# Patient Record
Sex: Female | Born: 1975 | Race: White | Hispanic: No | Marital: Single | Smoking: Current every day smoker
Health system: Southern US, Community
[De-identification: ages and names within clinical notes are randomized; demographics above are authoritative.]

## PROBLEM LIST (undated history)

## (undated) DIAGNOSIS — L0292 Furuncle, unspecified: Secondary | ICD-10-CM

## (undated) DIAGNOSIS — M1990 Unspecified osteoarthritis, unspecified site: Secondary | ICD-10-CM

## (undated) DIAGNOSIS — F339 Major depressive disorder, recurrent, unspecified: Secondary | ICD-10-CM

## (undated) DIAGNOSIS — N920 Excessive and frequent menstruation with regular cycle: Secondary | ICD-10-CM

## (undated) DIAGNOSIS — R222 Localized swelling, mass and lump, trunk: Secondary | ICD-10-CM

## (undated) DIAGNOSIS — L0293 Carbuncle, unspecified: Secondary | ICD-10-CM

## (undated) DIAGNOSIS — A630 Anogenital (venereal) warts: Secondary | ICD-10-CM

## (undated) DIAGNOSIS — N393 Stress incontinence (female) (male): Secondary | ICD-10-CM

## (undated) DIAGNOSIS — F419 Anxiety disorder, unspecified: Secondary | ICD-10-CM

## (undated) DIAGNOSIS — G43909 Migraine, unspecified, not intractable, without status migrainosus: Secondary | ICD-10-CM

## (undated) DIAGNOSIS — J45909 Unspecified asthma, uncomplicated: Secondary | ICD-10-CM

## (undated) DIAGNOSIS — I10 Essential (primary) hypertension: Secondary | ICD-10-CM

## (undated) HISTORY — DX: Carbuncle, unspecified: L02.93

## (undated) HISTORY — DX: Excessive and frequent menstruation with regular cycle: N92.0

## (undated) HISTORY — DX: Anogenital (venereal) warts: A63.0

## (undated) HISTORY — PX: TUBAL LIGATION: SHX77

## (undated) HISTORY — DX: Furuncle, unspecified: L02.92

## (undated) HISTORY — DX: Anxiety disorder, unspecified: F41.9

## (undated) HISTORY — DX: Major depressive disorder, recurrent, unspecified: F33.9

## (undated) HISTORY — DX: Stress incontinence (female) (male): N39.3

---

## 1998-10-10 ENCOUNTER — Emergency Department (HOSPITAL_COMMUNITY): Admission: EM | Admit: 1998-10-10 | Discharge: 1998-10-10 | Payer: Self-pay | Admitting: *Deleted

## 1998-10-10 ENCOUNTER — Encounter: Payer: Self-pay | Admitting: Emergency Medicine

## 1999-03-07 ENCOUNTER — Encounter: Admission: RE | Admit: 1999-03-07 | Discharge: 1999-03-07 | Payer: Self-pay | Admitting: Family Medicine

## 1999-04-03 ENCOUNTER — Encounter: Admission: RE | Admit: 1999-04-03 | Discharge: 1999-04-03 | Payer: Self-pay | Admitting: Family Medicine

## 1999-06-20 ENCOUNTER — Encounter: Admission: RE | Admit: 1999-06-20 | Discharge: 1999-06-20 | Payer: Self-pay | Admitting: Family Medicine

## 1999-07-04 ENCOUNTER — Encounter: Admission: RE | Admit: 1999-07-04 | Discharge: 1999-07-04 | Payer: Self-pay | Admitting: Family Medicine

## 1999-07-17 ENCOUNTER — Encounter: Admission: RE | Admit: 1999-07-17 | Discharge: 1999-07-17 | Payer: Self-pay | Admitting: Family Medicine

## 1999-09-20 ENCOUNTER — Encounter: Admission: RE | Admit: 1999-09-20 | Discharge: 1999-09-20 | Payer: Self-pay | Admitting: Family Medicine

## 1999-11-14 ENCOUNTER — Encounter: Admission: RE | Admit: 1999-11-14 | Discharge: 1999-11-14 | Payer: Self-pay | Admitting: Family Medicine

## 2000-11-07 ENCOUNTER — Other Ambulatory Visit: Admission: RE | Admit: 2000-11-07 | Discharge: 2000-11-07 | Payer: Self-pay | Admitting: Family Medicine

## 2000-11-07 ENCOUNTER — Encounter: Admission: RE | Admit: 2000-11-07 | Discharge: 2000-11-07 | Payer: Self-pay | Admitting: Family Medicine

## 2000-12-23 ENCOUNTER — Encounter: Admission: RE | Admit: 2000-12-23 | Discharge: 2000-12-23 | Payer: Self-pay | Admitting: Family Medicine

## 2001-01-30 ENCOUNTER — Encounter: Admission: RE | Admit: 2001-01-30 | Discharge: 2001-01-30 | Payer: Self-pay | Admitting: Family Medicine

## 2001-02-06 ENCOUNTER — Encounter: Payer: Self-pay | Admitting: Emergency Medicine

## 2001-02-06 ENCOUNTER — Emergency Department (HOSPITAL_COMMUNITY): Admission: EM | Admit: 2001-02-06 | Discharge: 2001-02-06 | Payer: Self-pay | Admitting: Emergency Medicine

## 2001-02-12 ENCOUNTER — Encounter: Admission: RE | Admit: 2001-02-12 | Discharge: 2001-02-12 | Payer: Self-pay | Admitting: Sports Medicine

## 2001-02-12 ENCOUNTER — Encounter: Payer: Self-pay | Admitting: Sports Medicine

## 2001-02-17 ENCOUNTER — Encounter: Admission: RE | Admit: 2001-02-17 | Discharge: 2001-02-17 | Payer: Self-pay | Admitting: Family Medicine

## 2001-03-23 ENCOUNTER — Emergency Department (HOSPITAL_COMMUNITY): Admission: EM | Admit: 2001-03-23 | Discharge: 2001-03-23 | Payer: Self-pay | Admitting: Emergency Medicine

## 2001-03-24 ENCOUNTER — Encounter: Admission: RE | Admit: 2001-03-24 | Discharge: 2001-03-24 | Payer: Self-pay | Admitting: Family Medicine

## 2001-05-01 ENCOUNTER — Encounter: Admission: RE | Admit: 2001-05-01 | Discharge: 2001-05-01 | Payer: Self-pay | Admitting: Family Medicine

## 2001-06-03 ENCOUNTER — Encounter: Admission: RE | Admit: 2001-06-03 | Discharge: 2001-06-03 | Payer: Self-pay | Admitting: Family Medicine

## 2001-09-07 ENCOUNTER — Encounter: Admission: RE | Admit: 2001-09-07 | Discharge: 2001-09-07 | Payer: Self-pay | Admitting: Family Medicine

## 2001-09-21 ENCOUNTER — Encounter: Admission: RE | Admit: 2001-09-21 | Discharge: 2001-09-21 | Payer: Self-pay | Admitting: Family Medicine

## 2001-11-03 ENCOUNTER — Encounter: Admission: RE | Admit: 2001-11-03 | Discharge: 2001-11-03 | Payer: Self-pay | Admitting: Family Medicine

## 2001-11-05 ENCOUNTER — Encounter: Admission: RE | Admit: 2001-11-05 | Discharge: 2001-11-05 | Payer: Self-pay | Admitting: Sports Medicine

## 2001-11-17 ENCOUNTER — Encounter: Admission: RE | Admit: 2001-11-17 | Discharge: 2001-11-17 | Payer: Self-pay | Admitting: Family Medicine

## 2002-01-01 ENCOUNTER — Encounter: Payer: Self-pay | Admitting: Sports Medicine

## 2002-01-01 ENCOUNTER — Encounter: Admission: RE | Admit: 2002-01-01 | Discharge: 2002-01-01 | Payer: Self-pay | Admitting: Sports Medicine

## 2002-01-01 ENCOUNTER — Encounter: Admission: RE | Admit: 2002-01-01 | Discharge: 2002-01-01 | Payer: Self-pay | Admitting: Family Medicine

## 2002-01-15 ENCOUNTER — Emergency Department (HOSPITAL_COMMUNITY): Admission: EM | Admit: 2002-01-15 | Discharge: 2002-01-15 | Payer: Self-pay | Admitting: Emergency Medicine

## 2002-01-15 ENCOUNTER — Encounter: Admission: RE | Admit: 2002-01-15 | Discharge: 2002-01-15 | Payer: Self-pay | Admitting: Family Medicine

## 2002-02-24 ENCOUNTER — Encounter: Payer: Self-pay | Admitting: Emergency Medicine

## 2002-02-24 ENCOUNTER — Emergency Department (HOSPITAL_COMMUNITY): Admission: EM | Admit: 2002-02-24 | Discharge: 2002-02-24 | Payer: Self-pay | Admitting: Emergency Medicine

## 2002-02-26 ENCOUNTER — Emergency Department (HOSPITAL_COMMUNITY): Admission: EM | Admit: 2002-02-26 | Discharge: 2002-02-26 | Payer: Self-pay

## 2002-07-06 ENCOUNTER — Encounter: Admission: RE | Admit: 2002-07-06 | Discharge: 2002-07-06 | Payer: Self-pay | Admitting: Family Medicine

## 2002-07-07 ENCOUNTER — Encounter: Admission: RE | Admit: 2002-07-07 | Discharge: 2002-07-07 | Payer: Self-pay | Admitting: Family Medicine

## 2002-07-29 ENCOUNTER — Encounter: Admission: RE | Admit: 2002-07-29 | Discharge: 2002-07-29 | Payer: Self-pay | Admitting: Family Medicine

## 2002-07-29 ENCOUNTER — Other Ambulatory Visit: Admission: RE | Admit: 2002-07-29 | Discharge: 2002-07-29 | Payer: Self-pay | Admitting: Family Medicine

## 2002-08-31 ENCOUNTER — Encounter: Admission: RE | Admit: 2002-08-31 | Discharge: 2002-08-31 | Payer: Self-pay | Admitting: Family Medicine

## 2002-09-10 ENCOUNTER — Encounter: Admission: RE | Admit: 2002-09-10 | Discharge: 2002-09-10 | Payer: Self-pay | Admitting: Family Medicine

## 2002-09-29 ENCOUNTER — Encounter: Admission: RE | Admit: 2002-09-29 | Discharge: 2002-09-29 | Payer: Self-pay | Admitting: Family Medicine

## 2002-10-06 ENCOUNTER — Ambulatory Visit (HOSPITAL_COMMUNITY): Admission: RE | Admit: 2002-10-06 | Discharge: 2002-10-06 | Payer: Self-pay | Admitting: Family Medicine

## 2002-10-07 ENCOUNTER — Emergency Department (HOSPITAL_COMMUNITY): Admission: EM | Admit: 2002-10-07 | Discharge: 2002-10-07 | Payer: Self-pay

## 2002-10-27 ENCOUNTER — Encounter: Admission: RE | Admit: 2002-10-27 | Discharge: 2002-10-27 | Payer: Self-pay | Admitting: Family Medicine

## 2002-11-12 ENCOUNTER — Encounter: Admission: RE | Admit: 2002-11-12 | Discharge: 2002-11-12 | Payer: Self-pay | Admitting: Family Medicine

## 2002-12-01 ENCOUNTER — Encounter: Admission: RE | Admit: 2002-12-01 | Discharge: 2002-12-01 | Payer: Self-pay | Admitting: Family Medicine

## 2002-12-06 ENCOUNTER — Encounter: Admission: RE | Admit: 2002-12-06 | Discharge: 2002-12-06 | Payer: Self-pay | Admitting: Family Medicine

## 2002-12-08 ENCOUNTER — Inpatient Hospital Stay (HOSPITAL_COMMUNITY): Admission: AD | Admit: 2002-12-08 | Discharge: 2002-12-08 | Payer: Self-pay | Admitting: Obstetrics and Gynecology

## 2002-12-17 ENCOUNTER — Encounter: Admission: RE | Admit: 2002-12-17 | Discharge: 2002-12-17 | Payer: Self-pay | Admitting: Family Medicine

## 2002-12-31 ENCOUNTER — Encounter: Admission: RE | Admit: 2002-12-31 | Discharge: 2002-12-31 | Payer: Self-pay | Admitting: Family Medicine

## 2003-01-12 ENCOUNTER — Encounter: Admission: RE | Admit: 2003-01-12 | Discharge: 2003-01-12 | Payer: Self-pay | Admitting: Family Medicine

## 2003-01-31 ENCOUNTER — Inpatient Hospital Stay (HOSPITAL_COMMUNITY): Admission: AD | Admit: 2003-01-31 | Discharge: 2003-01-31 | Payer: Self-pay | Admitting: Family Medicine

## 2003-02-01 ENCOUNTER — Encounter: Admission: RE | Admit: 2003-02-01 | Discharge: 2003-02-01 | Payer: Self-pay | Admitting: Family Medicine

## 2003-02-08 ENCOUNTER — Encounter: Admission: RE | Admit: 2003-02-08 | Discharge: 2003-02-08 | Payer: Self-pay | Admitting: *Deleted

## 2003-02-09 ENCOUNTER — Ambulatory Visit (HOSPITAL_COMMUNITY): Admission: RE | Admit: 2003-02-09 | Discharge: 2003-02-09 | Payer: Self-pay | Admitting: *Deleted

## 2003-02-09 ENCOUNTER — Inpatient Hospital Stay (HOSPITAL_COMMUNITY): Admission: AD | Admit: 2003-02-09 | Discharge: 2003-02-09 | Payer: Self-pay | Admitting: Obstetrics and Gynecology

## 2003-02-10 ENCOUNTER — Encounter: Admission: RE | Admit: 2003-02-10 | Discharge: 2003-02-10 | Payer: Self-pay | Admitting: *Deleted

## 2003-02-10 ENCOUNTER — Encounter: Admission: RE | Admit: 2003-02-10 | Discharge: 2003-02-10 | Payer: Self-pay | Admitting: Family Medicine

## 2003-02-14 ENCOUNTER — Encounter: Admission: RE | Admit: 2003-02-14 | Discharge: 2003-02-14 | Payer: Self-pay | Admitting: *Deleted

## 2003-02-15 ENCOUNTER — Inpatient Hospital Stay (HOSPITAL_COMMUNITY): Admission: RE | Admit: 2003-02-15 | Discharge: 2003-02-15 | Payer: Self-pay | Admitting: Family Medicine

## 2003-02-15 ENCOUNTER — Encounter: Payer: Self-pay | Admitting: Family Medicine

## 2003-02-15 ENCOUNTER — Encounter: Admission: RE | Admit: 2003-02-15 | Discharge: 2003-02-15 | Payer: Self-pay | Admitting: *Deleted

## 2003-02-16 ENCOUNTER — Inpatient Hospital Stay (HOSPITAL_COMMUNITY): Admission: AD | Admit: 2003-02-16 | Discharge: 2003-02-19 | Payer: Self-pay | Admitting: Obstetrics and Gynecology

## 2003-02-16 ENCOUNTER — Encounter (INDEPENDENT_AMBULATORY_CARE_PROVIDER_SITE_OTHER): Payer: Self-pay | Admitting: Specialist

## 2003-02-23 ENCOUNTER — Encounter: Admission: RE | Admit: 2003-02-23 | Discharge: 2003-02-23 | Payer: Self-pay | Admitting: Family Medicine

## 2003-04-27 ENCOUNTER — Encounter: Admission: RE | Admit: 2003-04-27 | Discharge: 2003-04-27 | Payer: Self-pay | Admitting: Sports Medicine

## 2003-05-31 ENCOUNTER — Encounter: Admission: RE | Admit: 2003-05-31 | Discharge: 2003-08-09 | Payer: Self-pay | Admitting: Occupational Medicine

## 2003-05-31 ENCOUNTER — Encounter: Admission: RE | Admit: 2003-05-31 | Discharge: 2003-05-31 | Payer: Self-pay | Admitting: Occupational Medicine

## 2005-04-05 ENCOUNTER — Emergency Department (HOSPITAL_COMMUNITY): Admission: EM | Admit: 2005-04-05 | Discharge: 2005-04-05 | Payer: Self-pay | Admitting: Emergency Medicine

## 2005-10-07 ENCOUNTER — Emergency Department (HOSPITAL_COMMUNITY): Admission: EM | Admit: 2005-10-07 | Discharge: 2005-10-07 | Payer: Self-pay | Admitting: Family Medicine

## 2005-11-26 ENCOUNTER — Emergency Department (HOSPITAL_COMMUNITY): Admission: EM | Admit: 2005-11-26 | Discharge: 2005-11-26 | Payer: Self-pay | Admitting: Emergency Medicine

## 2006-03-21 ENCOUNTER — Emergency Department (HOSPITAL_COMMUNITY): Admission: EM | Admit: 2006-03-21 | Discharge: 2006-03-21 | Payer: Self-pay | Admitting: Family Medicine

## 2006-03-22 ENCOUNTER — Emergency Department (HOSPITAL_COMMUNITY): Admission: EM | Admit: 2006-03-22 | Discharge: 2006-03-22 | Payer: Self-pay | Admitting: Emergency Medicine

## 2006-04-03 ENCOUNTER — Emergency Department (HOSPITAL_COMMUNITY): Admission: EM | Admit: 2006-04-03 | Discharge: 2006-04-03 | Payer: Self-pay | Admitting: Family Medicine

## 2006-04-07 ENCOUNTER — Ambulatory Visit: Payer: Self-pay | Admitting: Family Medicine

## 2006-04-24 ENCOUNTER — Ambulatory Visit: Payer: Self-pay | Admitting: Family Medicine

## 2006-05-28 ENCOUNTER — Ambulatory Visit: Payer: Self-pay | Admitting: Family Medicine

## 2006-06-06 ENCOUNTER — Ambulatory Visit: Payer: Self-pay | Admitting: Family Medicine

## 2006-06-27 ENCOUNTER — Ambulatory Visit: Payer: Self-pay | Admitting: Family Medicine

## 2006-07-22 ENCOUNTER — Encounter (INDEPENDENT_AMBULATORY_CARE_PROVIDER_SITE_OTHER): Payer: Self-pay | Admitting: *Deleted

## 2006-08-01 ENCOUNTER — Encounter (INDEPENDENT_AMBULATORY_CARE_PROVIDER_SITE_OTHER): Payer: Self-pay | Admitting: Family Medicine

## 2006-08-01 ENCOUNTER — Other Ambulatory Visit: Admission: RE | Admit: 2006-08-01 | Discharge: 2006-08-01 | Payer: Self-pay | Admitting: Family Medicine

## 2006-08-01 ENCOUNTER — Ambulatory Visit: Payer: Self-pay | Admitting: Family Medicine

## 2006-08-01 LAB — CONVERTED CEMR LAB: Chlamydia, DNA Probe: NEGATIVE

## 2006-09-18 DIAGNOSIS — E669 Obesity, unspecified: Secondary | ICD-10-CM | POA: Insufficient documentation

## 2006-09-18 DIAGNOSIS — G43909 Migraine, unspecified, not intractable, without status migrainosus: Secondary | ICD-10-CM | POA: Insufficient documentation

## 2006-09-18 DIAGNOSIS — J45909 Unspecified asthma, uncomplicated: Secondary | ICD-10-CM | POA: Insufficient documentation

## 2006-09-18 DIAGNOSIS — F17200 Nicotine dependence, unspecified, uncomplicated: Secondary | ICD-10-CM | POA: Insufficient documentation

## 2006-09-18 DIAGNOSIS — N393 Stress incontinence (female) (male): Secondary | ICD-10-CM | POA: Insufficient documentation

## 2006-09-18 DIAGNOSIS — F339 Major depressive disorder, recurrent, unspecified: Secondary | ICD-10-CM

## 2006-09-18 HISTORY — DX: Major depressive disorder, recurrent, unspecified: F33.9

## 2006-09-18 HISTORY — DX: Stress incontinence (female) (male): N39.3

## 2006-09-19 ENCOUNTER — Encounter (INDEPENDENT_AMBULATORY_CARE_PROVIDER_SITE_OTHER): Payer: Self-pay | Admitting: *Deleted

## 2006-10-10 ENCOUNTER — Emergency Department (HOSPITAL_COMMUNITY): Admission: EM | Admit: 2006-10-10 | Discharge: 2006-10-10 | Payer: Self-pay | Admitting: Family Medicine

## 2006-11-07 ENCOUNTER — Ambulatory Visit: Payer: Self-pay | Admitting: Family Medicine

## 2006-11-07 DIAGNOSIS — E785 Hyperlipidemia, unspecified: Secondary | ICD-10-CM | POA: Insufficient documentation

## 2006-11-10 ENCOUNTER — Telehealth: Payer: Self-pay | Admitting: *Deleted

## 2006-11-19 ENCOUNTER — Telehealth: Payer: Self-pay | Admitting: *Deleted

## 2006-11-21 ENCOUNTER — Telehealth: Payer: Self-pay | Admitting: *Deleted

## 2006-11-26 ENCOUNTER — Telehealth: Payer: Self-pay | Admitting: *Deleted

## 2006-11-26 ENCOUNTER — Ambulatory Visit: Payer: Self-pay | Admitting: Family Medicine

## 2006-12-11 ENCOUNTER — Emergency Department (HOSPITAL_COMMUNITY): Admission: EM | Admit: 2006-12-11 | Discharge: 2006-12-11 | Payer: Self-pay | Admitting: Family Medicine

## 2006-12-26 ENCOUNTER — Telehealth: Payer: Self-pay | Admitting: *Deleted

## 2006-12-30 ENCOUNTER — Encounter (INDEPENDENT_AMBULATORY_CARE_PROVIDER_SITE_OTHER): Payer: Self-pay | Admitting: Family Medicine

## 2006-12-30 ENCOUNTER — Ambulatory Visit: Payer: Self-pay | Admitting: Sports Medicine

## 2006-12-30 DIAGNOSIS — L0292 Furuncle, unspecified: Secondary | ICD-10-CM | POA: Insufficient documentation

## 2006-12-30 DIAGNOSIS — L0293 Carbuncle, unspecified: Secondary | ICD-10-CM

## 2006-12-30 HISTORY — DX: Furuncle, unspecified: L02.92

## 2006-12-30 LAB — CONVERTED CEMR LAB
Hepatitis B Surface Ag: NEGATIVE
KOH Prep: NEGATIVE

## 2007-01-03 ENCOUNTER — Emergency Department (HOSPITAL_COMMUNITY): Admission: EM | Admit: 2007-01-03 | Discharge: 2007-01-03 | Payer: Self-pay | Admitting: Family Medicine

## 2007-01-06 ENCOUNTER — Telehealth: Payer: Self-pay | Admitting: *Deleted

## 2007-01-06 ENCOUNTER — Ambulatory Visit: Payer: Self-pay | Admitting: Sports Medicine

## 2007-04-22 ENCOUNTER — Telehealth (INDEPENDENT_AMBULATORY_CARE_PROVIDER_SITE_OTHER): Payer: Self-pay | Admitting: *Deleted

## 2007-04-22 ENCOUNTER — Ambulatory Visit: Payer: Self-pay | Admitting: Family Medicine

## 2007-04-22 LAB — CONVERTED CEMR LAB: Whiff Test: POSITIVE

## 2007-11-23 ENCOUNTER — Ambulatory Visit: Payer: Self-pay | Admitting: Sports Medicine

## 2007-11-23 ENCOUNTER — Encounter (INDEPENDENT_AMBULATORY_CARE_PROVIDER_SITE_OTHER): Payer: Self-pay | Admitting: Family Medicine

## 2007-11-23 DIAGNOSIS — N898 Other specified noninflammatory disorders of vagina: Secondary | ICD-10-CM | POA: Insufficient documentation

## 2007-11-23 LAB — CONVERTED CEMR LAB
Ketones, urine, test strip: NEGATIVE
Nitrite: NEGATIVE
Protein, U semiquant: NEGATIVE
Urobilinogen, UA: 0.2

## 2007-11-24 ENCOUNTER — Telehealth (INDEPENDENT_AMBULATORY_CARE_PROVIDER_SITE_OTHER): Payer: Self-pay | Admitting: *Deleted

## 2007-11-24 LAB — CONVERTED CEMR LAB
ALT: 24 units/L (ref 0–35)
AST: 19 units/L (ref 0–37)
Albumin: 4.5 g/dL (ref 3.5–5.2)
Alkaline Phosphatase: 68 units/L (ref 39–117)
GC Probe Amp, Genital: NEGATIVE
Glucose, Bld: 86 mg/dL (ref 70–99)
HCV Ab: NEGATIVE
Potassium: 4.5 meq/L (ref 3.5–5.3)
Sodium: 139 meq/L (ref 135–145)
Total Protein: 7.3 g/dL (ref 6.0–8.3)

## 2007-11-30 ENCOUNTER — Encounter: Payer: Self-pay | Admitting: *Deleted

## 2008-04-05 ENCOUNTER — Emergency Department (HOSPITAL_COMMUNITY): Admission: EM | Admit: 2008-04-05 | Discharge: 2008-04-05 | Payer: Self-pay | Admitting: Internal Medicine

## 2008-06-07 ENCOUNTER — Telehealth: Payer: Self-pay | Admitting: *Deleted

## 2008-06-07 ENCOUNTER — Encounter: Payer: Self-pay | Admitting: Family Medicine

## 2008-06-07 ENCOUNTER — Ambulatory Visit: Payer: Self-pay | Admitting: Family Medicine

## 2008-06-07 LAB — CONVERTED CEMR LAB
Bilirubin Urine: NEGATIVE
Blood in Urine, dipstick: NEGATIVE
Glucose, Urine, Semiquant: NEGATIVE
Protein, U semiquant: 30
Specific Gravity, Urine: 1.02
Whiff Test: NEGATIVE
pH: 7

## 2008-06-08 ENCOUNTER — Telehealth: Payer: Self-pay | Admitting: Family Medicine

## 2008-10-13 ENCOUNTER — Emergency Department (HOSPITAL_COMMUNITY): Admission: EM | Admit: 2008-10-13 | Discharge: 2008-10-13 | Payer: Self-pay | Admitting: Emergency Medicine

## 2009-04-01 ENCOUNTER — Emergency Department (HOSPITAL_COMMUNITY): Admission: EM | Admit: 2009-04-01 | Discharge: 2009-04-01 | Payer: Self-pay | Admitting: Emergency Medicine

## 2009-04-22 ENCOUNTER — Emergency Department (HOSPITAL_COMMUNITY): Admission: EM | Admit: 2009-04-22 | Discharge: 2009-04-23 | Payer: Self-pay | Admitting: Emergency Medicine

## 2009-06-05 ENCOUNTER — Ambulatory Visit: Payer: Self-pay | Admitting: Family Medicine

## 2009-06-05 ENCOUNTER — Encounter (INDEPENDENT_AMBULATORY_CARE_PROVIDER_SITE_OTHER): Payer: Self-pay | Admitting: Family Medicine

## 2009-06-05 ENCOUNTER — Other Ambulatory Visit: Admission: RE | Admit: 2009-06-05 | Discharge: 2009-06-05 | Payer: Self-pay | Admitting: Family Medicine

## 2009-06-05 ENCOUNTER — Encounter: Payer: Self-pay | Admitting: Family Medicine

## 2009-06-05 DIAGNOSIS — A630 Anogenital (venereal) warts: Secondary | ICD-10-CM

## 2009-06-05 DIAGNOSIS — I10 Essential (primary) hypertension: Secondary | ICD-10-CM | POA: Insufficient documentation

## 2009-06-05 HISTORY — DX: Anogenital (venereal) warts: A63.0

## 2009-06-05 LAB — CONVERTED CEMR LAB: Whiff Test: POSITIVE

## 2009-06-14 ENCOUNTER — Telehealth: Payer: Self-pay | Admitting: Family Medicine

## 2009-06-19 ENCOUNTER — Encounter: Payer: Self-pay | Admitting: Family Medicine

## 2009-06-27 ENCOUNTER — Ambulatory Visit: Payer: Self-pay | Admitting: Family Medicine

## 2009-06-27 ENCOUNTER — Encounter: Payer: Self-pay | Admitting: Family Medicine

## 2009-06-27 DIAGNOSIS — N920 Excessive and frequent menstruation with regular cycle: Secondary | ICD-10-CM | POA: Insufficient documentation

## 2009-06-27 HISTORY — DX: Excessive and frequent menstruation with regular cycle: N92.0

## 2009-06-27 LAB — CONVERTED CEMR LAB: Beta hcg, urine, semiquantitative: NEGATIVE

## 2009-06-28 LAB — CONVERTED CEMR LAB
Basophils Relative: 0 % (ref 0–1)
Eosinophils Absolute: 0.2 10*3/uL (ref 0.0–0.7)
Lymphs Abs: 2.9 10*3/uL (ref 0.7–4.0)
MCV: 94.5 fL (ref 78.0–100.0)
Monocytes Relative: 5 % (ref 3–12)
Neutro Abs: 5 10*3/uL (ref 1.7–7.7)
Neutrophils Relative %: 58 % (ref 43–77)
Platelets: 248 10*3/uL (ref 150–400)
RBC: 4.54 M/uL (ref 3.87–5.11)
TSH: 0.921 microintl units/mL (ref 0.350–4.500)
WBC: 8.5 10*3/uL (ref 4.0–10.5)

## 2009-07-10 ENCOUNTER — Encounter: Admission: RE | Admit: 2009-07-10 | Discharge: 2009-07-10 | Payer: Self-pay | Admitting: Family Medicine

## 2009-07-10 ENCOUNTER — Telehealth: Payer: Self-pay | Admitting: Family Medicine

## 2009-07-31 ENCOUNTER — Telehealth: Payer: Self-pay | Admitting: Family Medicine

## 2009-08-01 ENCOUNTER — Ambulatory Visit: Payer: Self-pay | Admitting: Family Medicine

## 2009-08-08 ENCOUNTER — Telehealth: Payer: Self-pay | Admitting: Family Medicine

## 2009-08-24 ENCOUNTER — Ambulatory Visit: Payer: Self-pay | Admitting: Obstetrics and Gynecology

## 2009-09-22 ENCOUNTER — Telehealth: Payer: Self-pay | Admitting: Family Medicine

## 2009-09-25 ENCOUNTER — Telehealth: Payer: Self-pay | Admitting: Family Medicine

## 2009-12-31 ENCOUNTER — Emergency Department (HOSPITAL_COMMUNITY): Admission: EM | Admit: 2009-12-31 | Discharge: 2009-12-31 | Payer: Self-pay | Admitting: Family Medicine

## 2010-02-01 ENCOUNTER — Ambulatory Visit: Payer: Self-pay | Admitting: Obstetrics and Gynecology

## 2010-05-31 ENCOUNTER — Emergency Department (HOSPITAL_COMMUNITY): Admission: EM | Admit: 2010-05-31 | Discharge: 2010-05-31 | Payer: Self-pay | Admitting: Family Medicine

## 2010-05-31 ENCOUNTER — Telehealth: Payer: Self-pay | Admitting: Family Medicine

## 2010-08-21 NOTE — Progress Notes (Signed)
Summary: triage  Phone Note Call from Patient Call back at [phone]   Caller: Patient Summary of Call: Pt had bacterial infection and was treated, but needs to talk to a nurse about it. Initial call taken by: Clydell Hakim,  July 31, 2009 12:17 PM  Follow-up for Phone Call        [facility] [address] states she keeps getting bv. it is treated & then she needs meds for yeast. does not want to have to come back in. told her I will send message to pcp & will call her with his response Follow-up by: Golden Circle RN,  July 31, 2009 12:29 PM  Additional Follow-up for Phone Call Additional follow up Details #1::        Pt is due for a follow up anyways for her recent vaginal bleeding and intervention with NSAIDs.  We can discuss her acute issue as well.   Additional Follow-up by: Marisue Ivan  MD,  July 31, 2009 5:19 PM

## 2010-08-21 NOTE — Progress Notes (Signed)
Summary: refill HCTZ  Phone Note Refill Request Call back at [phone] Message from:  Patient  Refills Requested: Medication #1:  HYDROCHLOROTHIAZIDE 25 MG TABS 1 tab by mouth daily. [facility] told her to check with [place]  Initial call taken by: De Nurse,  May 31, 2010 11:14 AM    Prescriptions: HYDROCHLOROTHIAZIDE 25 MG TABS (HYDROCHLOROTHIAZIDE) 1 tab by mouth daily  #60 x 0   Entered and Authorized by:   Jamie Brookes MD   Signed by:   Jamie Brookes MD on 05/31/2010   Method used:   Electronically to        [facility] [phone]* (retail)       [address]       [address]       [place], [place]  [address]       Ph: [phone]       Fax: [phone]   RxID:   [id number]

## 2010-08-21 NOTE — Progress Notes (Signed)
Summary: phn msg  Phone Note Call from Patient Call back at Home Phone [phone]   Caller: Patient Summary of Call: bp reading 144/85 taken at [facility] - will be taking meds Initial call taken by: De Nurse,  September 25, 2009 10:51 AM  Follow-up for Phone Call        Agree that she should continue with medication Follow-up by: Marisue Ivan  MD,  September 25, 2009 1:43 PM

## 2010-08-21 NOTE — Assessment & Plan Note (Signed)
Summary: recurrent BV & menorrhagia   Vital Signs:  Patient profile:   35 year old female Height:      64 inches Weight:      210.5 pounds BMI:     36.26 Temp:     98.3 degrees F oral Pulse rate:   85 / minute BP sitting:   126 / 77  (left arm) Cuff size:   regular  Vitals Entered By: Gladstone Pih (August 01, 2009 3:15 PM) CC: recurrent BV Is Patient Diabetic? No Pain Assessment Patient in pain? no        Primary Care Provider:  Marisue Ivan  MD  CC:  recurrent BV.  History of Present Illness: 35yo F here to discuss recurrent BV and vaginal bleeding  Recurrent BV: States that she is currently having symptoms of BV which she gets often.  Complaining of odor and inc vaginal discharge.  No dysuria or hematuria.  Has chronic abd cramps around the time of menses.  She is concerned b/c she always develops yeast infections after treatment of BV.  Denies any fevers at this time.  Vaginal bleeding: Continues to c/o heavy bleeding with periods with associated abd/pelvic cramps.  She tried the diclofenac per Dr. Martin Majestic recommendations and states that she felt worse with the medication and stopped it.  She is s/p transvaginal U/S that conveyed echogenic focus within the endometrium of 12 x 5 x5 mm.  Considerations are that of endometrial polyp, blood, or possibly submucosal fibroid.  Denies any syncope, dizziness, SOB.    Habits & Providers  Alcohol-Tobacco-Diet     Tobacco Status: never  Allergies: 1)  ! Clindamycin  Past History:  Past Medical History: HTN Menorrhagia Recurrent Boils Depression Venereal Warts Tobacco use HLD Obesity G3P2012  Social History: Smoking Status:  never  Review of Systems        Denies any syncope, dizziness, SOB.    Physical Exam  General:  VS reviewed.  Obese, non-ill appearing, NAD Abdomen:  soft, NT, ND Genitalia:  declined pelvic exam Skin:  healing boil on abdomen   Impression & Recommendations:  Problem # 1:   MENORRHAGIA (ICD-626.2) Assessment Unchanged >5 months now.  She is s/p transvaginal U/S that conveyed echogenic focus within the endometrium of 12 x 5 x5 mm.  Plan to send to [facility] for further evaluation...possible hysterscopy to better visualize the abnormality.  No signs of anemia especially since her Hgb is 15 (12/7).  Will f/u after recommendations per Gyn.  Problem # 2:  VAGINAL DISCHARGE (ICD-623.5) Assessment: Deteriorated Will go ahead and treat for BV given her recurrent symptoms.  Will treat prophylactically for yeast given her history.    Orders: [facility]- Est  Level 4 ([address])  Complete Medication List: 1)  Hydrochlorothiazide 25 Mg Tabs (Hydrochlorothiazide) .... One tablet by mouth daily 2)  Flagyl 500 Mg Tabs (Metronidazole) .Marland Kitchen.. 1 tab by mouth two times a day x 7 days 3)  Fluconazole 150 Mg Tabs (Fluconazole) .Marland Kitchen.. 1 tab by mouth once for yeast infection  Other Orders: Gynecologic Referral (Gyn)  Patient Instructions: 1)  Schedule follow up with me after you have been seen by Gyn specialist at [facility]. 2)  I'm treating you for both yeast and BV today. 3)  Call me if your symptoms don't improve. Prescriptions: FLUCONAZOLE 150 MG TABS (FLUCONAZOLE) 1 tab by mouth once for yeast infection  #1 x 0   Entered and Authorized by:   Marisue Ivan  MD   Signed by:  Marisue Ivan  MD on 08/01/2009   Method used:   Print then Give to Patient   RxID:   [id number] FLAGYL 500 MG TABS (METRONIDAZOLE) 1 tab by mouth two times a day x 7 days  #14 x 0   Entered and Authorized by:   Marisue Ivan  MD   Signed by:   Marisue Ivan  MD on 08/01/2009   Method used:   Print then Give to Patient   RxID:   [id number]

## 2010-08-21 NOTE — Progress Notes (Signed)
Summary: HCTZ 25mg  qd #90 x1  Phone Note Call from Patient Call back at [phone]   Caller: Patient Summary of Call: pt states that she didn't know she was taken off of her BP meds - she was here in Jan and nothing was said at that time - pls advise  [facility] [address] Initial call taken by: De Nurse,  September 22, 2009 11:40 AM  Follow-up for Phone Call        Spoke with patient.  Explained that I did not initially prescribe the medication.  Advised her to check her BP.  She has not checked her bp recently.  If it is above 140/90 on 2 separate occasions, she should restart the HCTZ.  Will go ahead and provide 6 month supply. Follow-up by: Marisue Ivan  MD,  September 25, 2009 8:58 AM    New/Updated Medications: HYDROCHLOROTHIAZIDE 25 MG TABS (HYDROCHLOROTHIAZIDE) 1 tab by mouth daily Prescriptions: HYDROCHLOROTHIAZIDE 25 MG TABS (HYDROCHLOROTHIAZIDE) 1 tab by mouth daily  #90 x 1   Entered and Authorized by:   Marisue Ivan  MD   Signed by:   Marisue Ivan  MD on 09/25/2009   Method used:   Electronically to        [facility] [address] (retail)       [phone]       [place], [place]  [address]       Ph: [phone]       Fax: [phone]   RxID:   [id number]

## 2010-08-21 NOTE — Progress Notes (Signed)
Summary: phn msg  Phone Note Call from Patient Call back at [phone]   Caller: Patient Summary of Call: has been there in the past for really bad menses pain and would like something called in for it - she started yesterday and is in need of pain meds. [facility] [address] Initial call taken by: De Nurse,  August 08, 2009 8:36 AM  Follow-up for Phone Call        c/o bad menstrual cramping & lots of clots. taking 800 mg ibuprofen q4 hrs. told her no more often that every 8 hours & take with food. she is unable to leave work for an appt. [facility] but wants help now. will ask pcp if willing to call something in. will call pt when he responds Follow-up by: Golden Circle RN,  August 08, 2009 9:08 AM  Additional Follow-up for Phone Call Additional follow up Details #1::        clinic policy to not call any pain meds in without office visit. Additional Follow-up by: Marisue Ivan  MD,  August 08, 2009 3:15 PM     Appended Document: phn msg told her md response. she is unwilling to take off work "especially since he knows what I am going thru". refused appt

## 2010-08-22 ENCOUNTER — Encounter: Payer: Self-pay | Admitting: *Deleted

## 2010-09-06 ENCOUNTER — Other Ambulatory Visit: Payer: Self-pay | Admitting: Family Medicine

## 2010-09-06 DIAGNOSIS — I10 Essential (primary) hypertension: Secondary | ICD-10-CM

## 2010-09-06 MED ORDER — HYDROCHLOROTHIAZIDE 25 MG PO TABS: 25.0000 mg | ORAL_TABLET | Freq: Every day | ORAL | Status: DC

## 2010-10-02 LAB — POCT RAPID STREP A (OFFICE): Streptococcus, Group A Screen (Direct): NEGATIVE

## 2010-10-08 LAB — POCT URINALYSIS DIP (DEVICE)
Ketones, ur: NEGATIVE mg/dL
Protein, ur: NEGATIVE mg/dL
Specific Gravity, Urine: 1.015 (ref 1.005–1.030)
Urobilinogen, UA: 0.2 mg/dL (ref 0.0–1.0)
pH: 8.5 — ABNORMAL HIGH (ref 5.0–8.0)

## 2010-10-08 LAB — GC/CHLAMYDIA PROBE AMP, GENITAL
Chlamydia, DNA Probe: NEGATIVE
GC Probe Amp, Genital: NEGATIVE

## 2010-10-08 LAB — WET PREP, GENITAL

## 2010-10-25 LAB — WET PREP, GENITAL: WBC, Wet Prep HPF POC: NONE SEEN

## 2010-10-25 LAB — URINALYSIS, ROUTINE W REFLEX MICROSCOPIC
Ketones, ur: NEGATIVE mg/dL
Leukocytes, UA: NEGATIVE
Nitrite: NEGATIVE
Protein, ur: NEGATIVE mg/dL
Urobilinogen, UA: 0.2 mg/dL (ref 0.0–1.0)
pH: 6 (ref 5.0–8.0)

## 2010-10-25 LAB — URINE MICROSCOPIC-ADD ON

## 2010-12-07 NOTE — Op Note (Signed)
NAME:  Kristin Perry, Kristin Perry                      ACCOUNT NO.:  [id number]   MEDICAL RECORD NO.:  [id number]                   PATIENT TYPE:  INP   LOCATION:  9147                                 FACILITY:  [facility]   PHYSICIAN:  Phil D. Okey Dupre, M.D.                  DATE OF BIRTH:  06-07-1976   DATE OF PROCEDURE:  02/16/2003  DATE OF DISCHARGE:                                 OPERATIVE REPORT   PROCEDURE:  Low transverse cesarean section and bilateral tubal ligation.   PREOPERATIVE DIAGNOSIS:  Large for gestational age baby, macrosomia,  gestational diabetes, voluntary sterilization.   POSTOPERATIVE DIAGNOSIS:  Large for gestational age baby, macrosomia,  gestational diabetes, voluntary sterilization.   SURGEON:  Javier Glazier. Okey Dupre, M.D.   ASSISTANT:  Medical student Janee Morn   ANESTHESIA:  Spinal.   ESTIMATED BLOOD LOSS:  800 mL.   OPERATIVE FINDINGS:  A female infant in the LOP presentation, weighed 8  pounds plus.   PROCEDURE IN DETAIL:  Under satisfactory spinal anesthesia, with the patient  in the dorsal supine position and a Foley catheter in the urinary bladder,  the abdomen was prepped and draped in the usual sterile manner.  We entered  through a Pfannenstiel incision situated 3 cm above the symphysis pubis and  extending for a total length of 18 cm.  The abdomen was entered by layers.  On entering the peritoneal cavity, the visceroperitoneum on the anterior  surface of the uterus was opened transversely by sharp dissection and the  bladder pushed away from the lower uterine segment which was entered by  sharp and blunt dissection.  From the LOP presentation, the baby was easily  delivered, the cord doubly clamped and divided, the baby handed to the  pediatrician after being suctioned, mild and clear.  The placenta was  spontaneously removed, the uterus explored and closed with a continuous  running locked 0 Vicryl on an atraumatic needle.  Two figure-of-eight  sutures were  placed in the uterine suture line for reinforcement and control  of a couple of oozing.  The area was observed for bleeding, none was noted.  Each fallopian tube was then grasped in the mid portion and opened beneath  the tube, a #1 plain suture was tied around the tube at a distal and  proximal end forming a loop of approximately 2 cm above the tie.  A second  tie was placed just below the aforementioned and the section of tube above  the ties was excised and the ends of the tube thus exposed were coagulated  with hot cautery.  The areas were observed for bleeding and none was noted.  The suture line of the uterus was then re-examined, no bleeding was noted.  The area was irrigated with normal saline and the fascia was closed with  continuous running alternating locked 0 Vicryl on an atraumatic needle.  Subcutaneous closure was accomplished with 0  plain catgut suture.  The  subcutaneous bleeders were controlled with hot cautery  and the skin edges were reapproximated with skin staples.  Dry, sterile  dressing was applied and the patient was transferred to the recovery room in  satisfactory condition having tolerated the procedure well with an 800 mL  blood loss.  Instrument, sponge and needle counts were reported as correct  at the end of the procedure.                                               Phil D. Okey Dupre, M.D.    PDR/MEDQ  D:  02/16/2003  T:  02/16/2003  Job:  [id number]

## 2010-12-07 NOTE — Discharge Summary (Signed)
   NAME:  Kristin, Perry                      ACCOUNT NO.:  [id number]   MEDICAL RECORD NO.:  [id number]                   PATIENT TYPE:  INP   LOCATION:  9147                                 [facility]   PHYSICIAN:  Phil D. Okey Dupre, M.D.                  DATE OF BIRTH:  28-Dec-1975   DATE OF ADMISSION:  02/16/2003  DATE OF DISCHARGE:  02/19/2003                                 DISCHARGE SUMMARY   DISCHARGE DIAGNOSES:  1. Status post low transverse cesarean section large for gestational age     fetus.  2. Status post bilateral tubal ligation.  3. Status post delivery of a viable female infant.   DISCHARGE MEDICATIONS:  1. Percocet 5/325, 1 to 2 tabs p.o. q.4-6h. p.r.n. severe pain.  2. Ibuprofen 600 mg p.o. q.6h. p.r.n. pain.  3. Prenatal vitamins one tablet p.o. daily x6 weeks.   FOLLOW UP:  The patient is to follow up at [facility] with  Dr. Silas Sacramento in six weeks.  Additionally, she should have her staples  removed by Dr. Neva Seat at her baby's appointment next Wednesday, February 23, 2003.   ADMISSION HISTORY AND PHYSICAL:  A 35 year old, G3, P1-0-1-1, at 29 and 4,  admitted with an LGA infant, and secondary to gestational diabetes.  The  patient was admitted with a mature baby by amniocentesis with macrosomia,  and was taken for cesarean section and BTL.   HOSPITAL COURSE:  The patient had a routine postpartum and postoperative  course and had no significant difficulties throughout her hospitalization.  Routine orders were followed, and the patient was ready for discharge on  postoperative day #3.   DISCHARGE LABORATORIES:  WBC's 14.7, hemoglobin 11.7, platelets 216.  Sodium  136, potassium 3.9, chloride 109, CO2 22, glucose 70, BUN 5, creatinine 0.6,  and calcium 9.5.  RPR negative.  The patient is discharged to home without  further incident.     Jonah Blue, M.D.                      Phil D. Okey Dupre, M.D.    Milas Gain  D:  02/19/2003  T:   02/19/2003  Job:  [id number]   cc:   Silas Sacramento, M.D.  [facility].  Family [address] Resident  [place], [place] [address]  Fax: [phone]

## 2011-04-24 ENCOUNTER — Other Ambulatory Visit: Payer: Self-pay | Admitting: Family Medicine

## 2011-04-24 ENCOUNTER — Other Ambulatory Visit: Payer: Self-pay | Admitting: Obstetrics and Gynecology

## 2011-04-24 ENCOUNTER — Telehealth: Payer: Self-pay | Admitting: *Deleted

## 2011-04-24 DIAGNOSIS — A499 Bacterial infection, unspecified: Secondary | ICD-10-CM

## 2011-04-24 NOTE — Telephone Encounter (Signed)
I will refill this medication, but patient needs to schedule an appointment to meet new doctor and lab work (BMET) at her earliest convenience.

## 2011-04-24 NOTE — Telephone Encounter (Signed)
Spoke with pt and attempted to make an appt for her however, she no longer has insurance. I asked her if she has applied for [place] and she informed me that she no longer lives in [place] and she is trying to find a way to be seen

## 2011-04-24 NOTE — Telephone Encounter (Signed)
Refill request

## 2011-04-29 ENCOUNTER — Telehealth: Payer: Self-pay | Admitting: *Deleted

## 2011-04-29 NOTE — Telephone Encounter (Signed)
Pt not seen in our clinic.

## 2011-05-27 ENCOUNTER — Telehealth: Payer: Self-pay | Admitting: *Deleted

## 2011-05-27 DIAGNOSIS — A499 Bacterial infection, unspecified: Secondary | ICD-10-CM

## 2011-05-27 NOTE — Telephone Encounter (Signed)
Pt left message requesting a nurse to call back.  I left message that I was returning her call. I requested that she call back with a detailed message as to her question, concern or problem

## 2011-05-28 MED ORDER — BORIC ACID CRYS: 600.0000 mg | CRYSTALS | Status: DC

## 2011-05-28 NOTE — Telephone Encounter (Signed)
Returned pt's call and pt informed me that she needed a Rx refill on Boric Acid for her infection.  Pt informed me that she currently does not have insurance and that she is trying to get in at the health department currently and they should take of her infection there but needs a refill of the Rx till then.

## 2011-05-29 NOTE — Telephone Encounter (Signed)
Pt was called and informed of her refill being to sent to pharmacy.  I confirmed pharmacy with the pt.

## 2011-07-04 ENCOUNTER — Other Ambulatory Visit: Payer: Self-pay

## 2011-07-04 ENCOUNTER — Encounter: Payer: Self-pay | Admitting: *Deleted

## 2011-07-04 ENCOUNTER — Emergency Department (HOSPITAL_COMMUNITY)
Admission: EM | Admit: 2011-07-04 | Discharge: 2011-07-04 | Disposition: A | Discharge status: Home / Self Care | Payer: Self-pay | Attending: Emergency Medicine | Admitting: Emergency Medicine

## 2011-07-04 DIAGNOSIS — R110 Nausea: Secondary | ICD-10-CM | POA: Insufficient documentation

## 2011-07-04 DIAGNOSIS — R51 Headache: Secondary | ICD-10-CM | POA: Insufficient documentation

## 2011-07-04 DIAGNOSIS — H53149 Visual discomfort, unspecified: Secondary | ICD-10-CM | POA: Insufficient documentation

## 2011-07-04 DIAGNOSIS — R079 Chest pain, unspecified: Secondary | ICD-10-CM | POA: Insufficient documentation

## 2011-07-04 DIAGNOSIS — M25519 Pain in unspecified shoulder: Secondary | ICD-10-CM | POA: Insufficient documentation

## 2011-07-04 DIAGNOSIS — Z79899 Other long term (current) drug therapy: Secondary | ICD-10-CM | POA: Insufficient documentation

## 2011-07-04 DIAGNOSIS — I10 Essential (primary) hypertension: Secondary | ICD-10-CM | POA: Insufficient documentation

## 2011-07-04 HISTORY — DX: Essential (primary) hypertension: I10

## 2011-07-04 HISTORY — DX: Migraine, unspecified, not intractable, without status migrainosus: G43.909

## 2011-07-04 LAB — BASIC METABOLIC PANEL
GFR calc Af Amer: >90 mL/min (ref >90)
GFR calc non Af Amer: >90 mL/min (ref >90)
Potassium: 3.7 mEq/L (ref 3.5–5.1)
Sodium: 136 mEq/L (ref 135–145)

## 2011-07-04 LAB — TROPONIN I: Troponin I: <0.3 ng/mL (ref <0.30)

## 2011-07-04 MED ORDER — METOCLOPRAMIDE HCL 10 MG PO TABS: 10.0000 mg | ORAL_TABLET | Freq: Four times a day (QID) | ORAL | Status: AC

## 2011-07-04 MED ORDER — METOCLOPRAMIDE HCL 5 MG/ML IJ SOLN
10.0000 mg | Freq: Once | INTRAMUSCULAR | Status: AC
  Administered 2011-07-04: 10 mg via INTRAVENOUS
  Filled 2011-07-04: qty 2

## 2011-07-04 MED ORDER — KETOROLAC TROMETHAMINE 30 MG/ML IJ SOLN
30.0000 mg | Freq: Once | INTRAMUSCULAR | Status: AC
  Administered 2011-07-04: 30 mg via INTRAVENOUS
  Filled 2011-07-04: qty 1

## 2011-07-04 MED ORDER — NAPROXEN 500 MG PO TABS: 500.0000 mg | ORAL_TABLET | Freq: Two times a day (BID) | ORAL | Status: DC

## 2011-07-04 NOTE — ED Provider Notes (Signed)
History     CSN: [id number] Arrival date & time: 07/04/2011  4:27 AM   First MD Initiated Contact with Patient 07/04/11 [phone]      Chief Complaint  Patient presents with  . Migraine    (Consider location/radiation/quality/duration/timing/severity/associated sxs/prior treatment) HPI Comments: 35 year old female with a history of headaches and hypertension presents with a complaint of headache. This has been onset this evening, awoke with pain which she describes as throbbing, occipital and upper neck, similar to prior headaches. She took a Excedrin medication prior to arrival with no improvement. She denies fevers chills coughing abdominal pain swelling diarrhea or dysuria. She admits to having nausea, photophobia and admits to having constant chest pain for the last 3 days which seems to be worse with palpation of the chest, not made worse with exertion, deep breathing, position or eating. Currently her headache is severe  Patient is a 35 y.o. female presenting with migraine. The history is provided by the patient and a relative.  Migraine    Past Medical History  Diagnosis Date  . Migraines   . Hypertension     History reviewed. No pertinent past surgical history.  History reviewed. No pertinent family history.  History  Substance Use Topics  . Smoking status: Current Everyday Smoker -- 1.0 packs/day  . Smokeless tobacco: Not on file  . Alcohol Use: No    OB History    Grav Para Term Preterm Abortions TAB SAB Ect Mult Living                  Review of Systems  All other systems reviewed and are negative.    Allergies  Clindamycin  Home Medications   Current Outpatient Rx  Name Route Sig Dispense Refill  . BORIC ACID CRYS Vaginal Place 600 mg vaginally 2 (two) times a week. 500 g 5  . BORIC ACID TOPICAL POWD  INSERT 1 CAPSULE PER VAGINA 1 ST 3 DAYS EVERY MONTHS 1 Bottle 12  . HYDROCHLOROTHIAZIDE 25 MG PO TABS  TAKE 1 TABLET BY MOUTH EVERY DAY 30 tablet 4  .  METOCLOPRAMIDE HCL 10 MG PO TABS Oral Take 1 tablet (10 mg total) by mouth every 6 (six) hours. 30 tablet 0  . NAPROXEN 500 MG PO TABS Oral Take 1 tablet (500 mg total) by mouth 2 (two) times daily with a meal. 30 tablet 0    BP 100/67  Pulse 76  Temp(Src) 97.8 F (36.6 C) (Oral)  Resp 20  Ht 5\' 5"  (1.651 m)  Wt 204 lb (92.534 kg)  BMI 33.95 kg/m2  SpO2 98%  LMP 06/30/2011  Physical Exam  Nursing note and vitals reviewed. Constitutional: She appears well-developed and well-nourished.  HENT:  Head: Normocephalic and atraumatic.  Mouth/Throat: Oropharynx is clear and moist. No oropharyngeal exudate.  Eyes: Conjunctivae and EOM are normal. Pupils are equal, round, and reactive to light. Right eye exhibits no discharge. Left eye exhibits no discharge. No scleral icterus.  Neck: Normal range of motion. Neck supple. No JVD present. No thyromegaly present.  Cardiovascular: Normal rate, regular rhythm, normal heart sounds and intact distal pulses.  Exam reveals no gallop and no friction rub.   No murmur heard. Pulmonary/Chest: Effort normal and breath sounds normal. No respiratory distress. She has no wheezes. She has no rales. She exhibits tenderness ( Reproducible tenderness to palpation over the bilateral upper chest and left shoulder. Chaperone present for chest exam).  Abdominal: Soft. Bowel sounds are normal. She exhibits no distension and  no mass. There is no tenderness.  Musculoskeletal: Normal range of motion. She exhibits no edema and no tenderness.  Lymphadenopathy:    She has no cervical adenopathy.  Neurological: She is alert. Coordination normal.       Speech clear, moves all extremities, normal sensation, normal mentation, normal pupillary exam  Skin: Skin is warm and dry. No rash noted. No erythema.  Psychiatric: She has a normal mood and affect. Her behavior is normal.    ED Course  Procedures (including critical care time)  Labs Reviewed  BASIC METABOLIC PANEL -  Abnormal; Notable for the following:    Glucose, Bld 117 (*)    All other components within normal limits  TROPONIN I   No results found.   1. Headache   2. Chest pain       MDM  Reproducible chest pain, normal EKG, headache similar to prior migraines. Has associated nausea and photophobia consistent with migraine headache. Migraine cocktail ordered.  ED ECG REPORT   Date: 07/04/2011   Rate: 61  Rhythm: normal sinus rhythm  QRS Axis: normal  Intervals: normal  ST/T Wave abnormalities: normal  Conduction Disutrbances:none  Narrative Interpretation:   Old EKG Reviewed: unchanged since 11/26/2005  Electrolytes normal, renal function normal, troponin negative  Patient feels much better after medications including headache and chest pain. Will discharge home.  Vida Roller, MD 07/04/11 [phone]

## 2011-07-04 NOTE — ED Notes (Addendum)
Pt states ha since 0300. Pt woke up from sleep with the headache.Headache is throbbing located the back of the head Pt also states CP center of chest along with L arm pain. Pt also states nausea along with vomiting. Pt rates pain at a 10

## 2011-07-04 NOTE — ED Notes (Addendum)
Patient complaining of headache since 0300 today. Also c/o nausea/vomiting. Has history of same. Patient states her chest has been tight since yesterday radiating into left arm and shoulder.

## 2011-07-04 NOTE — ED Notes (Signed)
Pt is alert and oriented x 4 with respirations even and unlabored.  NAD at this time.  Discharge instructions and Rx x 2 reviewed with pt and pt verbalized understanding.  Pt ambulated with steady gait to lobby and husband to transport pt home.

## 2011-11-27 LAB — CBC
ALT: 33 U/L (ref 7–35)
AST: 26 U/L
Cholesterol: 224 mg/dL — AB (ref 0–200)
Creat: 0.77
Total Bilirubin: 0.3 mg/dL

## 2012-01-29 ENCOUNTER — Encounter: Payer: Self-pay | Admitting: Family Medicine

## 2012-01-29 ENCOUNTER — Ambulatory Visit (INDEPENDENT_AMBULATORY_CARE_PROVIDER_SITE_OTHER): Payer: Self-pay | Admitting: Family Medicine

## 2012-01-29 VITALS — BP 165/88 | HR 88 | Temp 98.8°F | Ht 65.0 in | Wt 209.0 lb

## 2012-01-29 DIAGNOSIS — F17200 Nicotine dependence, unspecified, uncomplicated: Secondary | ICD-10-CM

## 2012-01-29 DIAGNOSIS — I10 Essential (primary) hypertension: Secondary | ICD-10-CM

## 2012-01-29 LAB — COMPREHENSIVE METABOLIC PANEL
ALT: 19 U/L (ref 0–35)
AST: 17 U/L (ref 0–37)
Albumin: 4.3 g/dL (ref 3.5–5.2)
Alkaline Phosphatase: 75 U/L (ref 39–117)
Calcium: 10 mg/dL (ref 8.4–10.5)
Chloride: 105 mEq/L (ref 96–112)
Potassium: 4.1 mEq/L (ref 3.5–5.3)
Sodium: 139 mEq/L (ref 135–145)

## 2012-01-29 MED ORDER — VERAPAMIL HCL 80 MG PO TABS: 80.0000 mg | ORAL_TABLET | Freq: Three times a day (TID) | ORAL | Status: DC

## 2012-01-29 NOTE — Assessment & Plan Note (Addendum)
A: symptomatic HTN with likely vascular headaches. P: -verapamil for HTN tx and headache ppx -check CMP and TSH -pateint signed release of med records form to get labs from [facility]. Free clinic.  -close f/u in 2 weeks.

## 2012-01-29 NOTE — Progress Notes (Signed)
Subjective:     Patient ID: Kristin Perry, female   DOB: 29-Jul-1975, 36 y.o.   MRN: [id number]  HPI 36 yo F with a history of HTN since 2009 and smoking presents for HTN f/u.  HTN: patient has been out of medication, lisinopril 10 mg daily, x 3 months. She has not been seen here is a long time due to moving to [facility]. She continue to smoke 1PPD x 20 years. She admits to sweats, weight gain, occasional cloudy vision, daily headaches, palpitations, shortness of breath at rest and on exertion. She denies chest pain and LE edema. She denies illicit drug use except for occasional marijuana.   Review of Systems As per HPI    Objective:   Physical Exam BP 165/88  Pulse 88  Temp 98.8 F (37.1 C) (Oral)  Ht 5\' 5"  (1.651 m)  Wt 209 lb (94.802 kg)  BMI 34.78 kg/m2  LMP 01/29/2012 General appearance: alert, cooperative and no distress Eyes: conjunctivae/corneas clear. PERRL, EOM's intact. Fundi benign. Neck: no adenopathy, no carotid bruit, no JVD, supple, symmetrical, trachea midline and thyroid not enlarged, symmetric, no tenderness/mass/nodules Lungs: decreased air movement bilaterally,  clear to auscultation bilaterally Heart: regular rate and rhythm, S1, S2 normal, no murmur, click, rub or gallop Extremities: extremities normal, atraumatic, no cyanosis or edema Pulses: 2+ and symmetric Neurologic: Grossly normal  Assessment and Plan:

## 2012-01-29 NOTE — Patient Instructions (Addendum)
Kristin Perry,  Thank you for coming in to see me today. Please pick up and start verapamil- it will be $10 for the 90.   Smoking cessation support: smoking cessation hotline: 1-800-QUIT-NOW.  Here is the number to the smoking cessation classes at [facility]: 161-0960  F/u with me in two weeks.   Dr. Dessa Phi

## 2012-01-29 NOTE — Assessment & Plan Note (Signed)
A: contemplative. P: per AVS. Quit line and referred to free cessation classes at [facility].

## 2012-02-03 ENCOUNTER — Telehealth: Payer: Self-pay | Admitting: *Deleted

## 2012-02-03 NOTE — Telephone Encounter (Signed)
Message copied by Aram Beecham on Mon Feb 03, 2012  4:10 PM ------      Message from: Dessa Phi      Created: Mon Feb 03, 2012  3:14 PM       All labs normal. Will repeat in 1 year.       Please patient to let her know.

## 2012-02-03 NOTE — Telephone Encounter (Signed)
Left message on voicemail informing of normal labs.

## 2012-02-12 ENCOUNTER — Ambulatory Visit (INDEPENDENT_AMBULATORY_CARE_PROVIDER_SITE_OTHER): Payer: Self-pay | Admitting: Family Medicine

## 2012-02-12 ENCOUNTER — Other Ambulatory Visit: Payer: Self-pay | Admitting: Family Medicine

## 2012-02-12 ENCOUNTER — Encounter: Payer: Self-pay | Admitting: Family Medicine

## 2012-02-12 VITALS — BP 124/78 | HR 88 | Temp 98.2°F | Ht 64.0 in | Wt 209.0 lb

## 2012-02-12 DIAGNOSIS — N898 Other specified noninflammatory disorders of vagina: Secondary | ICD-10-CM

## 2012-02-12 DIAGNOSIS — F17200 Nicotine dependence, unspecified, uncomplicated: Secondary | ICD-10-CM

## 2012-02-12 DIAGNOSIS — I10 Essential (primary) hypertension: Secondary | ICD-10-CM

## 2012-02-12 DIAGNOSIS — B9689 Other specified bacterial agents as the cause of diseases classified elsewhere: Secondary | ICD-10-CM

## 2012-02-12 DIAGNOSIS — A499 Bacterial infection, unspecified: Secondary | ICD-10-CM

## 2012-02-12 MED ORDER — BORIC ACID TOPICAL POWD: 1.0000 | Freq: Every day | Status: DC

## 2012-02-12 MED ORDER — METRONIDAZOLE 500 MG PO TABS: 500.0000 mg | ORAL_TABLET | Freq: Two times a day (BID) | ORAL | Status: DC

## 2012-02-12 MED ORDER — VERAPAMIL HCL 80 MG PO TABS: 80.0000 mg | ORAL_TABLET | Freq: Three times a day (TID) | ORAL | Status: DC

## 2012-02-12 NOTE — Assessment & Plan Note (Addendum)
A: unchanged. Still contemplative.  P: see AVS. Advised patient to set a quit date.

## 2012-02-12 NOTE — Patient Instructions (Addendum)
Antia,  Thank you for coming in to see me today.   Your BP is wonderful! Please continue verapamil.   For smoking cessation: The [employer] is $239.99, $ 216.99 with prescription  savings club. Best bet is to pick a quit date, start a regular exercise regimen now and give it your best shot.  There are no failures, as long as your a working towards quitting.  Smoking cessation support: smoking cessation hotline: 1-800-QUIT-NOW.  Here is the number to the smoking cessation classes at [facility]: [phone].  Dr. Armen Pickup

## 2012-02-12 NOTE — Assessment & Plan Note (Addendum)
A: well controlled.  Med: compliant with verapamil. P:  F/u in 3-6 months.  Smoking cessation.

## 2012-02-12 NOTE — Progress Notes (Signed)
Subjective:     Patient ID: Kristin Perry, female   DOB: Dec 01, 1975, 36 y.o.   MRN: [id number]  HPI 36 yo F presents for f/u to discuss the following:  1. HTN: still has HA. This is decreasing. No headache in the past 3 days. She denies CP, SOB and leg edema except at ankles. She is taking verapamil TID, sometimes forget last dose of the day. She is still smoking.  2. Smoking: 1PPD. Thinking about quitting. Worried about weight gain. Interested in chantix but fears she cannot afford it.   2. BV: chronic. Has been treated with multiple courses of flagyl and clindamycin in the past. Reports discharge and fishy odor. Boric acid capsules have been most effective but she has not been able to afford them recently.   Review of Systems As per HPI Objective:   Physical Exam BP 124/78  Pulse 88  Temp 98.2 F (36.8 C) (Oral)  Ht 5\' 4"  (1.626 m)  Wt 209 lb (94.802 kg)  BMI 35.87 kg/m2  LMP 01/29/2012 General appearance: alert, cooperative and no distress Lungs: clear to auscultation bilaterally Heart: regular rate and rhythm, S1, S2 normal, no murmur, click, rub or gallop Extremities: extremities normal, atraumatic, no cyanosis or edema Neurologic: Grossly normal  Assessment and Plan:

## 2012-02-12 NOTE — Assessment & Plan Note (Signed)
A: patient with chronic BV. Allergy to flagyl and clindamycin P: boric acid tablet daily x 21 days, will space as tolerated.

## 2012-02-26 ENCOUNTER — Telehealth: Payer: Self-pay | Admitting: Family Medicine

## 2012-02-26 DIAGNOSIS — A499 Bacterial infection, unspecified: Secondary | ICD-10-CM

## 2012-02-26 NOTE — Telephone Encounter (Signed)
States that the pharmacy [facility] [place] - doesn't have the script for the Borac acid and also did not get the other med that Dr Armen Pickup was going to send in that went along with that.  She got the Verapamil but had spoken with [employer] after appt and [employer] told her that she would call in something comparable to Flagyl

## 2012-02-28 MED ORDER — CLINDAMYCIN HCL 300 MG PO CAPS: 300.0000 mg | ORAL_CAPSULE | Freq: Two times a day (BID) | ORAL | Status: AC

## 2012-02-28 MED ORDER — BORIC ACID TOPICAL POWD: 1.0000 | Freq: Every day | Status: DC

## 2012-02-28 NOTE — Telephone Encounter (Signed)
Called patient back. Resent boric acid. She assures me that she is not allergic to clindamycin, the allergy was listed in error. Sent in clindamycin as well.

## 2012-03-04 ENCOUNTER — Encounter: Payer: Self-pay | Admitting: Family Medicine

## 2012-03-12 ENCOUNTER — Ambulatory Visit: Payer: Self-pay

## 2012-03-12 ENCOUNTER — Telehealth: Payer: Self-pay | Admitting: Family Medicine

## 2012-03-12 NOTE — Telephone Encounter (Signed)
Patient would like to speak to the nurse about irregularities in her heart beat.

## 2012-03-12 NOTE — Telephone Encounter (Signed)
Patient reports starting new medication for BP a month ago. Three days ago developed sensation of heart pounding " jumping out of chest " every 3-4 minutes.  Has SOB with these episodes.Marland Kitchen Consulted with Dr. [place] and she advises patient needs to come in today. Patient at first stated could not come today but will talk with her boss and try to come on now.

## 2012-03-13 ENCOUNTER — Ambulatory Visit (HOSPITAL_COMMUNITY)
Admission: RE | Admit: 2012-03-13 | Discharge: 2012-03-13 | Disposition: A | Discharge status: Home / Self Care | Payer: Self-pay | Source: Ambulatory Visit | Attending: Family Medicine | Admitting: Family Medicine

## 2012-03-13 ENCOUNTER — Ambulatory Visit (INDEPENDENT_AMBULATORY_CARE_PROVIDER_SITE_OTHER): Payer: Self-pay | Admitting: Family Medicine

## 2012-03-13 ENCOUNTER — Encounter: Payer: Self-pay | Admitting: Family Medicine

## 2012-03-13 VITALS — BP 162/84 | HR 81 | Temp 98.4°F | Ht 64.0 in | Wt 210.0 lb

## 2012-03-13 DIAGNOSIS — I10 Essential (primary) hypertension: Secondary | ICD-10-CM | POA: Insufficient documentation

## 2012-03-13 DIAGNOSIS — R002 Palpitations: Secondary | ICD-10-CM | POA: Insufficient documentation

## 2012-03-13 MED ORDER — CLONAZEPAM 0.5 MG PO TABS: 0.5000 mg | ORAL_TABLET | Freq: Two times a day (BID) | ORAL | Status: DC | PRN

## 2012-03-13 NOTE — Patient Instructions (Signed)
Your EKG is normal.  I do not think the feeling you are having is due to something being wrong with your heart.  I suspect this is related to stress and anxiety.  I want you to try this medication when you feel your heart racing.  If you feel better, that will help [place] know that the feeling you are having is from your heart.  Please make an appointment to see Dr. Armen Pickup in about two weeks to follow up this and your blood pressure.

## 2012-03-15 DIAGNOSIS — F419 Anxiety disorder, unspecified: Secondary | ICD-10-CM | POA: Insufficient documentation

## 2012-03-15 HISTORY — DX: Anxiety disorder, unspecified: F41.9

## 2012-03-15 NOTE — Progress Notes (Signed)
  Subjective:    Patient ID: Kristin Perry, female    DOB: 1975-11-09, 36 y.o.   MRN: [id number]  HPI  [name] comes in because she has been feeling her heart pounding intermittently over the past few days.  She denies any associated chest pain, but does feel a little short of breath with the episodes.  She says it comes and goes without anything she can think of that makes it stop or makes it go away. She denies dizziness, LE swelling, neck or arm pain, or diaphoresis. She does complain of the feeling of heart racing right now.  She has never had an irregular heart beat or heart attack before.  She has had problems with depression in the past, but not with panic attacks or anxiety.  She does admit to being under quite a bit of stress right now.  She is taking verapamil for her migraines and her HTN.  She denies chest pain, and does say it helps some with her headaches.  She has not missed doses of the medication.     Past Medical History  Diagnosis Date  . Migraines   . Hypertension    History  Substance Use Topics  . Smoking status: Current Everyday Smoker -- 1.0 packs/day  . Smokeless tobacco: Not on file  . Alcohol Use: No  \  Review of Systems See HPI    Objective:   Physical Exam BP 162/84  Pulse 81  Temp 98.4 F (36.9 C) (Oral)  Ht 5\' 4"  (1.626 m)  Wt 210 lb (95.255 kg)  BMI 36.05 kg/m2  SpO2 98%  LMP 02/12/2012 General appearance: alert, cooperative and no distress Neck: no adenopathy, no carotid bruit, no JVD, supple, symmetrical, trachea midline and thyroid not enlarged, symmetric, no tenderness/mass/nodules Lungs: clear to auscultation bilaterally Heart: regular rate and rhythm, S1, S2 normal, no murmur, click, rub or gallop Extremities: extremities normal, atraumatic, no cyanosis or edema Pulses: 2+ and symmetric  ECG: Rate 76 Rhythm: NSR, with PCV Axis: Normal Interval, normal No T-wave changes ECG is unchanged from prior study.      Assessment &  Plan:

## 2012-03-15 NOTE — Assessment & Plan Note (Signed)
Elevated today.  Patient is taking verapamil (originally started for migraines).  Due to feeling of palpitations +/- anxiety, will not make medication changes today.  She will follow up with PCP in two weeks.

## 2012-03-15 NOTE — Assessment & Plan Note (Signed)
Patient is currently complaining of feeling of palpitations, but not actually having rapid heart beat.  She may be feeling PVC's.  However, this may be an anxiety problem or panic attack.  No red flags for cardiac disease.  I reassured patient.  Will try klonopin to see if feeling of palpitations improves with anxiety treatment.  She is to follow up with her PCP in 2 weeks.

## 2012-03-26 ENCOUNTER — Encounter: Payer: Self-pay | Admitting: Family Medicine

## 2012-03-26 ENCOUNTER — Ambulatory Visit (INDEPENDENT_AMBULATORY_CARE_PROVIDER_SITE_OTHER): Payer: Self-pay | Admitting: Family Medicine

## 2012-03-26 VITALS — BP 142/86 | HR 88 | Temp 98.3°F | Ht 64.0 in | Wt 208.0 lb

## 2012-03-26 DIAGNOSIS — F17200 Nicotine dependence, unspecified, uncomplicated: Secondary | ICD-10-CM

## 2012-03-26 DIAGNOSIS — G43909 Migraine, unspecified, not intractable, without status migrainosus: Secondary | ICD-10-CM

## 2012-03-26 DIAGNOSIS — R002 Palpitations: Secondary | ICD-10-CM

## 2012-03-26 DIAGNOSIS — I10 Essential (primary) hypertension: Secondary | ICD-10-CM

## 2012-03-26 MED ORDER — CLONAZEPAM 0.5 MG PO TABS: 0.5000 mg | ORAL_TABLET | Freq: Two times a day (BID) | ORAL | Status: DC | PRN

## 2012-03-26 MED ORDER — NAPROXEN 500 MG PO TABS: 500.0000 mg | ORAL_TABLET | Freq: Two times a day (BID) | ORAL | Status: DC | PRN

## 2012-03-26 MED ORDER — CITALOPRAM HYDROBROMIDE 20 MG PO TABS: 20.0000 mg | ORAL_TABLET | Freq: Every day | ORAL | Status: DC

## 2012-03-26 MED ORDER — HYDROCHLOROTHIAZIDE 12.5 MG PO TABS: 12.5000 mg | ORAL_TABLET | Freq: Every day | ORAL | Status: DC

## 2012-03-26 NOTE — Assessment & Plan Note (Signed)
A: BP not yet at goal of < 140/90. Near goal. Patient complains of feeling swollen but has no edema on exam. P: Start HCTZ 12.5 mg daily -continue verapamil TID for HTN and  migraine prophylaxis

## 2012-03-26 NOTE — Assessment & Plan Note (Signed)
A: persistent. 1 PPD. Has cessation resources. Contemplative about quitting.  P: -re-iterated resources  -treat anxiety

## 2012-03-26 NOTE — Progress Notes (Signed)
Subjective:     Patient ID: Kristin Perry, female   DOB: 1975-07-26, 36 y.o.   MRN: [id number]  HPI 36 yo F presents for f/u visit to discuss the following:  1. Palpitations: resolved with klonopin. She takes one daily and may take another in the evening if needed.  She feels that they were due to anxiety. She reports excessive worry as major feature. She worries about her children, 5 between ages of 36-17. Youngest with ADHD. She smokes to relieve stress. Does not drink alcohol. Has previous history of depression which resolved after divorce. She denies suicidal ideation and depressed mood.   2. HTN: compliant with verapamil. Denies CP, SOB and LE edema. HAs have improved with klonopin as well.   3. Tobacco: still smoking the same amount. 1PPD. Has occasional dry cough. No SOB or sputum production.   Review of Systems As per HPI     Objective:   Physical Exam LMP 02/12/2012 General appearance: alert, cooperative and no distress Eyes: conjunctivae/corneas clear. PERRL, EOM's intact.  Throat: lips, mucosa, and tongue normal; teeth and gums normal Neck: no adenopathy, no carotid bruit, no JVD, supple, symmetrical, trachea midline and thyroid not enlarged, symmetric, no tenderness/mass/nodules Lungs: clear to auscultation bilaterally Heart: regular rate and rhythm, S1, S2 normal, no murmur, click, rub or gallop Extremities: extremities normal, atraumatic, no cyanosis or edema Pulses: 2+ and symmetric Neurologic: Grossly normal  GAD &: score of 13. Moderate anxiety. Somewhat difficult to function.  Answered 1 to questions 4,5 and 7.  Answered 2 to questions 1 nad 6.  Answered 3 to question 2 and 3.      Assessment and Plan:

## 2012-03-26 NOTE — Patient Instructions (Addendum)
[name],  Thank you for coming in to see me today. I am happy to hear that you responded well to klonopin.  1. Anxiety:  Start celexa once daily  Will refill klonopin with goal of eventually not needing it.  I recommend some form of other stress relief-exercise, dance, singing, something healthy and fun.    2. HTN -continue verapamil -limit NSAIDs: naproxen, ibuprofen, excedrin, motrin to as little as possible. Recommend tylenol as first line for headaches.  -start HCTZ 12.5 mg daily .  3. Smoking cessation -keep this in mind.  -treating your stress/anxiety with medications ad lifestyle changes will help.  -Smoking cessation support: smoking cessation hotline: 1-800-QUIT-NOW.  Here is the number to the smoking cessation classes at [facility]: [phone]  F/u in 2-3 weeks.   Dr. Armen Pickup

## 2012-03-26 NOTE — Assessment & Plan Note (Signed)
A: moderate anxiety. Characterized mostly by worry. Improved with klonopin.  P: -start celexa  -refill klonopin with plan to step down as tolerated, patient aware.  -f/u in 2-3 weeks to monitor for side effects/adverse reactions

## 2012-04-24 ENCOUNTER — Ambulatory Visit: Payer: Self-pay | Admitting: Family Medicine

## 2012-05-04 ENCOUNTER — Ambulatory Visit: Payer: Self-pay | Admitting: Family Medicine

## 2012-05-25 ENCOUNTER — Ambulatory Visit (INDEPENDENT_AMBULATORY_CARE_PROVIDER_SITE_OTHER): Payer: Self-pay | Admitting: Family Medicine

## 2012-05-25 ENCOUNTER — Encounter: Payer: Self-pay | Admitting: Family Medicine

## 2012-05-25 ENCOUNTER — Other Ambulatory Visit: Payer: Self-pay | Admitting: Family Medicine

## 2012-05-25 VITALS — BP 130/88 | HR 82 | Temp 98.0°F | Ht 64.0 in | Wt 209.0 lb

## 2012-05-25 DIAGNOSIS — I10 Essential (primary) hypertension: Secondary | ICD-10-CM

## 2012-05-25 DIAGNOSIS — F17200 Nicotine dependence, unspecified, uncomplicated: Secondary | ICD-10-CM

## 2012-05-25 DIAGNOSIS — B9689 Other specified bacterial agents as the cause of diseases classified elsewhere: Secondary | ICD-10-CM

## 2012-05-25 DIAGNOSIS — F419 Anxiety disorder, unspecified: Secondary | ICD-10-CM

## 2012-05-25 DIAGNOSIS — A499 Bacterial infection, unspecified: Secondary | ICD-10-CM

## 2012-05-25 DIAGNOSIS — F411 Generalized anxiety disorder: Secondary | ICD-10-CM

## 2012-05-25 MED ORDER — HYDROCHLOROTHIAZIDE 12.5 MG PO TABS: 12.5000 mg | ORAL_TABLET | Freq: Every day | ORAL | Status: DC

## 2012-05-25 MED ORDER — BORIC ACID TOPICAL POWD: 1.0000 | Freq: Every day | Status: AC

## 2012-05-25 MED ORDER — SERTRALINE HCL 50 MG PO TABS: 50.0000 mg | ORAL_TABLET | Freq: Every day | ORAL | Status: DC

## 2012-05-25 MED ORDER — VERAPAMIL HCL 80 MG PO TABS: 80.0000 mg | ORAL_TABLET | Freq: Three times a day (TID) | ORAL | Status: DC

## 2012-05-25 MED ORDER — CLONAZEPAM 0.5 MG PO TABS: 0.5000 mg | ORAL_TABLET | Freq: Two times a day (BID) | ORAL | Status: DC | PRN

## 2012-05-25 NOTE — Progress Notes (Signed)
Subjective:     Patient ID: Kristin Perry, female   DOB: [birth year], 36 y.o.   MRN: [id number]  HPI 36 yo F presents for f/u visit to discuss the following:  1. HTN: compliant with medications. Denies CP, SOB. Has occasional dorsal foot edema. Twice daily headaches. Still smoking 1 PPD.   2. Anxiety: same. Took celexa and had worsening anxiety and GI upset. Took it for 3 days. Did not take it with klonopin. Denies decreased level of function. Denies suicidal ideation.   3. Smoking: 1 PPD. Is afraid of gaining weight when she quits. Would like to be on a good diet and exercise plan prior to smoking cessation.   Review of Systems As per HPI    Objective:   Physical Exam BP 144/89  Pulse 82  Temp 98 F (36.7 C) (Oral)  Ht 5\' 4"  (1.626 m)  Wt 209 lb (94.802 kg)  BMI 35.87 kg/m2  LMP 05/18/2012 General appearance: alert, cooperative, no distress and mildly obese Lungs: clear to auscultation bilaterally Heart: regular rate and rhythm, S1, S2 normal, no murmur, click, rub or gallop Extremities: extremities normal, atraumatic, no cyanosis or edema     Assessment and Plan:

## 2012-05-25 NOTE — Assessment & Plan Note (Signed)
A: contemplative about quitting.  P: counseled patient. Gave # to Wyona Almas, nutritionist.

## 2012-05-25 NOTE — Patient Instructions (Addendum)
Ms. Kyler,   Thank you for coming in to see me today.   I am happy with your BP 130/88. Goal is < 140/90.  I agree that establishing a consistent diet and exercise plan will be a useful step toward's smoking cessation.   -Google 2 day diet  - Call Wyona Almas at your convenience.  -Set a smoking quit date and tell family.   For anxiety, take klonopin , start zoloft.  -see me in 2 weeks after being on zoloft.  -d/c medication if you have depression or worsening anxiety.  Dr.Lorianne Malbrough

## 2012-05-25 NOTE — Assessment & Plan Note (Signed)
A: BP at goal. Meds: compliant P: continue current regimen. Smoking  Cessation.

## 2012-05-25 NOTE — Assessment & Plan Note (Signed)
A: worsened with celexa. Patient feels that it could be better.  P:  zoloft with klonopin. Slowly wean klonopin and increase zoloft as tolerated.

## 2012-06-02 ENCOUNTER — Telehealth: Payer: Self-pay | Admitting: Family Medicine

## 2012-06-02 NOTE — Telephone Encounter (Signed)
Patient is calling because she has not gotten the Rx for Zoloft filled yet because she has been on it before and remembers not having a good experience.  She would like to try something different and not expensive due to not having insurance.  She uses [facility] on [address].

## 2012-06-04 ENCOUNTER — Other Ambulatory Visit: Payer: Self-pay | Admitting: Family Medicine

## 2012-06-04 MED ORDER — FLUOXETINE HCL 20 MG PO TABS: 20.0000 mg | ORAL_TABLET | Freq: Every day | ORAL | Status: DC

## 2012-06-04 NOTE — Telephone Encounter (Signed)
Called patient back. Sent in prozac 20. D/Cd zoloft from med orders.

## 2012-06-22 ENCOUNTER — Ambulatory Visit: Payer: Self-pay | Admitting: Family Medicine

## 2012-07-01 ENCOUNTER — Other Ambulatory Visit: Payer: Self-pay | Admitting: Family Medicine

## 2012-07-01 MED ORDER — FLUOXETINE HCL 20 MG PO TABS: 20.0000 mg | ORAL_TABLET | Freq: Every day | ORAL | Status: DC

## 2012-07-01 NOTE — Addendum Note (Signed)
Addended by: Dessa Phi on: 07/01/2012 12:33 PM   Modules accepted: Orders

## 2012-07-01 NOTE — Telephone Encounter (Signed)
Patient is calling asking for a refill on Prozac.  She was doing a trial over the last month and hasn't had any problems with it.  She uses [facility] in [place].

## 2012-07-01 NOTE — Telephone Encounter (Signed)
Refill sent.

## 2012-09-14 ENCOUNTER — Emergency Department (HOSPITAL_COMMUNITY)
Admission: EM | Admit: 2012-09-14 | Discharge: 2012-09-15 | Disposition: A | Discharge status: Home / Self Care | Payer: Self-pay | Attending: Emergency Medicine | Admitting: Emergency Medicine

## 2012-09-14 ENCOUNTER — Encounter (HOSPITAL_COMMUNITY): Payer: Self-pay | Admitting: *Deleted

## 2012-09-14 DIAGNOSIS — R059 Cough, unspecified: Secondary | ICD-10-CM | POA: Insufficient documentation

## 2012-09-14 DIAGNOSIS — Z8619 Personal history of other infectious and parasitic diseases: Secondary | ICD-10-CM | POA: Insufficient documentation

## 2012-09-14 DIAGNOSIS — J111 Influenza due to unidentified influenza virus with other respiratory manifestations: Secondary | ICD-10-CM

## 2012-09-14 DIAGNOSIS — Z87448 Personal history of other diseases of urinary system: Secondary | ICD-10-CM | POA: Insufficient documentation

## 2012-09-14 DIAGNOSIS — F17200 Nicotine dependence, unspecified, uncomplicated: Secondary | ICD-10-CM | POA: Insufficient documentation

## 2012-09-14 DIAGNOSIS — Z8742 Personal history of other diseases of the female genital tract: Secondary | ICD-10-CM | POA: Insufficient documentation

## 2012-09-14 DIAGNOSIS — R197 Diarrhea, unspecified: Secondary | ICD-10-CM | POA: Insufficient documentation

## 2012-09-14 DIAGNOSIS — F339 Major depressive disorder, recurrent, unspecified: Secondary | ICD-10-CM | POA: Insufficient documentation

## 2012-09-14 DIAGNOSIS — R1110 Vomiting, unspecified: Secondary | ICD-10-CM | POA: Insufficient documentation

## 2012-09-14 DIAGNOSIS — G43909 Migraine, unspecified, not intractable, without status migrainosus: Secondary | ICD-10-CM | POA: Insufficient documentation

## 2012-09-14 DIAGNOSIS — I10 Essential (primary) hypertension: Secondary | ICD-10-CM | POA: Insufficient documentation

## 2012-09-14 DIAGNOSIS — Z7982 Long term (current) use of aspirin: Secondary | ICD-10-CM | POA: Insufficient documentation

## 2012-09-14 DIAGNOSIS — R509 Fever, unspecified: Secondary | ICD-10-CM | POA: Insufficient documentation

## 2012-09-14 DIAGNOSIS — Z872 Personal history of diseases of the skin and subcutaneous tissue: Secondary | ICD-10-CM | POA: Insufficient documentation

## 2012-09-14 DIAGNOSIS — R51 Headache: Secondary | ICD-10-CM | POA: Insufficient documentation

## 2012-09-14 DIAGNOSIS — R062 Wheezing: Secondary | ICD-10-CM | POA: Insufficient documentation

## 2012-09-14 DIAGNOSIS — R52 Pain, unspecified: Secondary | ICD-10-CM | POA: Insufficient documentation

## 2012-09-14 DIAGNOSIS — Z79899 Other long term (current) drug therapy: Secondary | ICD-10-CM | POA: Insufficient documentation

## 2012-09-14 MED ORDER — HYDROCODONE-ACETAMINOPHEN 5-325 MG PO TABS
1.0000 | ORAL_TABLET | Freq: Once | ORAL | Status: AC
  Administered 2012-09-14: 1 via ORAL
  Filled 2012-09-14: qty 1

## 2012-09-14 MED ORDER — ALBUTEROL SULFATE (5 MG/ML) 0.5% IN NEBU
2.5000 mg | INHALATION_SOLUTION | Freq: Once | RESPIRATORY_TRACT | Status: AC
  Administered 2012-09-14: 2.5 mg via RESPIRATORY_TRACT
  Filled 2012-09-14: qty 0.5

## 2012-09-14 MED ORDER — HYDROCODONE-ACETAMINOPHEN 5-325 MG PO TABS: 1.0000 | ORAL_TABLET | ORAL | Status: DC | PRN

## 2012-09-14 MED ORDER — IBUPROFEN 800 MG PO TABS
800.0000 mg | ORAL_TABLET | Freq: Once | ORAL | Status: AC
  Administered 2012-09-14: 800 mg via ORAL
  Filled 2012-09-14: qty 1

## 2012-09-14 MED ORDER — IPRATROPIUM BROMIDE 0.02 % IN SOLN
0.5000 mg | Freq: Once | RESPIRATORY_TRACT | Status: AC
  Administered 2012-09-14: 0.5 mg via RESPIRATORY_TRACT
  Filled 2012-09-14: qty 2.5

## 2012-09-14 MED ORDER — ONDANSETRON 4 MG PO TBDP
4.0000 mg | ORAL_TABLET | Freq: Once | ORAL | Status: AC
  Administered 2012-09-14: 4 mg via ORAL
  Filled 2012-09-14: qty 1

## 2012-09-14 MED ORDER — ONDANSETRON 4 MG PO TBDP: 4.0000 mg | ORAL_TABLET | Freq: Three times a day (TID) | ORAL | Status: DC | PRN

## 2012-09-14 NOTE — ED Notes (Signed)
Cough, fever, body aches, diarrhea,vomiting.

## 2012-09-14 NOTE — ED Notes (Signed)
Patient vomited after receiving medications.  Patient vomited into emesis bag.

## 2012-09-14 NOTE — ED Provider Notes (Signed)
History    This chart was scribed for [employer]. Colon Branch, MD by Charolett Bumpers, ED Scribe. The patient was seen in room APA06/APA06. Patient's care was started at 2304.   CSN: [id number]  Arrival date & time 09/14/12  2212   First MD Initiated Contact with Patient 09/14/12 2304      Chief Complaint  Patient presents with  . Cough   The history is provided by the patient. No language interpreter was used.   Kristin Perry is a 37 y.o. female who presents to the Emergency Department complaining of persistent, moderate cough with associated diarrhea, vomiting, fever, headache, body aches and wheezing that started earlier today. She last vomited an hour ago. Temperature here in ED is 99.3. She states that she has not had her influenza vaccine.    Past Medical History  Diagnosis Date  . Migraines   . Hypertension   . DEPRESSION, MAJOR, RECURRENT 09/18/2006    Qualifier: Diagnosis of  By: Bebe Shaggy    . VENEREAL WART 06/05/2009    Qualifier: Diagnosis of  By: [place], Bonnie    . MENORRHAGIA 06/27/2009    Qualifier: Diagnosis of  By: Burnadette Pop  MD, Trisha Mangle    . INCONTINENCE, STRESS, FEMALE 09/18/2006    Qualifier: Diagnosis of  By: Bebe Shaggy    . BOILS, RECURRENT 12/30/2006    Qualifier: Diagnosis of  By: Ludwig Clarks MD, [facility]      History reviewed. No pertinent past surgical history.  History reviewed. No pertinent family history.  History  Substance Use Topics  . Smoking status: Current Every Day Smoker -- 1.00 packs/day  . Smokeless tobacco: Not on file  . Alcohol Use: No    OB History   Grav Para Term Preterm Abortions TAB SAB Ect Mult Living                  Review of Systems A complete 10 system review of systems was obtained and all systems are negative except as noted in the HPI and PMH.   Allergies  Flagyl  Home Medications   Current Outpatient Rx  Name  Route  Sig  Dispense  Refill  . albuterol (PROVENTIL HFA;VENTOLIN HFA) 108 (90  BASE) MCG/ACT inhaler   Inhalation   Inhale 2 puffs into the lungs every 6 (six) hours as needed for wheezing or shortness of breath.         Marland Kitchen aspirin-acetaminophen-caffeine (EXCEDRIN MIGRAINE) 250-250-65 MG per tablet   Oral   Take 2 tablets by mouth daily as needed.          Marland Kitchen FLUoxetine (PROZAC) 20 MG tablet   Oral   Take 1 tablet (20 mg total) by mouth daily.   90 tablet   3     **Patient requests 90 days supply**   . hydrochlorothiazide (HYDRODIURIL) 12.5 MG tablet   Oral   Take 1 tablet (12.5 mg total) by mouth daily.   30 tablet   3   . ibuprofen (ADVIL,MOTRIN) 200 MG tablet   Oral   Take 800 mg by mouth 2 (two) times daily as needed for pain.         . naproxen (NAPROSYN) 500 MG tablet   Oral   Take 500 mg by mouth 2 (two) times daily between meals as needed (headache. ).         . verapamil (CALAN) 80 MG tablet   Oral   Take 1 tablet (80 mg total) by mouth 3 (three) times  daily.   180 tablet   3     BP 155/66  Pulse 112  Temp(Src) 99.3 F (37.4 C) (Oral)  Resp 20  Ht 5\' 4"  (1.626 m)  Wt 200 lb (90.719 kg)  BMI 34.31 kg/m2  SpO2 97%  LMP 09/14/2012  Physical Exam  Nursing note and vitals reviewed. Constitutional: She is oriented to person, place, and time. She appears well-developed and well-nourished. No distress.  HENT:  Head: Normocephalic and atraumatic.  Mouth/Throat: Oropharynx is clear and moist.  Eyes: EOM are normal. Pupils are equal, round, and reactive to light.  Neck: Normal range of motion. Neck supple. No tracheal deviation present.  Cardiovascular: Normal rate, regular rhythm and normal heart sounds.   Pulmonary/Chest: Effort normal and breath sounds normal. No respiratory distress.  Abdominal: Soft. Bowel sounds are normal. She exhibits no distension. There is no tenderness.  Musculoskeletal: Normal range of motion. She exhibits no edema.  Neurological: She is alert and oriented to person, place, and time.  Skin: Skin is  warm and dry.  Psychiatric: She has a normal mood and affect. Her behavior is normal.    ED Course  Procedures (including critical care time)  DIAGNOSTIC STUDIES: Oxygen Saturation is 97% on room air, normal by my interpretation.    COORDINATION OF CARE:  22:30-Medication Orders: Albuterol (Proventil) (5 mg/mL) 0.5% nebulizer solution 2.5 mg-once; Ipratropium (Atrovent) nebulizer solution 0.5 mg-once.   23:17-Pt has received a breathing treatment. Discussed planned course of treatment with the patient including d/c home with nausea and pain medication, who is agreeable at this time. Advised to get rest and drink plenty of fluids.   23:30-Medication Orders: Ondansetron (Zofran-ODT) disintegrating tablet 4 mg-once; Ibuprofen (Advil, Motrin) tablet 800 mg-once; Hydrocodone-acetaminophen (Norco/Vicodin) 5-325 mg per tablet 1 tablet-once.     MDM  Patient with the flu. Given ibuprofen and vicodin plus zofran. Home with zofran and vicodin.Pt stable in ED with no significant deterioration in condition.The patient appears reasonably screened and/or stabilized for discharge and I doubt any other medical condition or other [facility] requiring further screening, evaluation, or treatment in the ED at this time prior to discharge.  I personally performed the services described in this documentation, which was scribed in my presence. The recorded information has been reviewed and considered.   MDM Reviewed: nursing note and vitals            Nicoletta Dress. Colon Branch, MD 09/14/12 2351

## 2012-09-15 NOTE — ED Notes (Signed)
Dr. Colon Branch aware that patient vomited; patient to be discharged home.

## 2012-09-15 NOTE — ED Notes (Signed)
Discharge instructions given and reviewed with patient.  Prescriptions given for Zofran and Vicodin; effects and use explained.  Patient verbalized understanding to use Motrin for fever and aches and to take Vicodin for severe pain.  Patient verbalized understanding to take Zofran for nausea/vomiting.  Patient ambulatory with steady gait; discharged home in good condition.  Significant other accompanied discharge to drive home.

## 2012-09-23 ENCOUNTER — Other Ambulatory Visit: Payer: Self-pay | Admitting: Family Medicine

## 2012-09-23 ENCOUNTER — Encounter (HOSPITAL_COMMUNITY): Payer: Self-pay

## 2012-09-23 ENCOUNTER — Emergency Department (HOSPITAL_COMMUNITY): Payer: Self-pay

## 2012-09-23 ENCOUNTER — Emergency Department (HOSPITAL_COMMUNITY)
Admission: EM | Admit: 2012-09-23 | Discharge: 2012-09-23 | Disposition: A | Discharge status: Home / Self Care | Payer: Self-pay | Attending: Emergency Medicine | Admitting: Emergency Medicine

## 2012-09-23 DIAGNOSIS — X500XXA Overexertion from strenuous movement or load, initial encounter: Secondary | ICD-10-CM | POA: Insufficient documentation

## 2012-09-23 DIAGNOSIS — I10 Essential (primary) hypertension: Secondary | ICD-10-CM | POA: Insufficient documentation

## 2012-09-23 DIAGNOSIS — R059 Cough, unspecified: Secondary | ICD-10-CM | POA: Insufficient documentation

## 2012-09-23 DIAGNOSIS — F17200 Nicotine dependence, unspecified, uncomplicated: Secondary | ICD-10-CM | POA: Insufficient documentation

## 2012-09-23 DIAGNOSIS — Z79899 Other long term (current) drug therapy: Secondary | ICD-10-CM | POA: Insufficient documentation

## 2012-09-23 DIAGNOSIS — S39012A Strain of muscle, fascia and tendon of lower back, initial encounter: Secondary | ICD-10-CM

## 2012-09-23 DIAGNOSIS — Y9389 Activity, other specified: Secondary | ICD-10-CM | POA: Insufficient documentation

## 2012-09-23 DIAGNOSIS — R062 Wheezing: Secondary | ICD-10-CM | POA: Insufficient documentation

## 2012-09-23 DIAGNOSIS — S335XXA Sprain of ligaments of lumbar spine, initial encounter: Secondary | ICD-10-CM | POA: Insufficient documentation

## 2012-09-23 DIAGNOSIS — R0602 Shortness of breath: Secondary | ICD-10-CM | POA: Insufficient documentation

## 2012-09-23 DIAGNOSIS — Y929 Unspecified place or not applicable: Secondary | ICD-10-CM | POA: Insufficient documentation

## 2012-09-23 DIAGNOSIS — Z8742 Personal history of other diseases of the female genital tract: Secondary | ICD-10-CM | POA: Insufficient documentation

## 2012-09-23 DIAGNOSIS — Z872 Personal history of diseases of the skin and subcutaneous tissue: Secondary | ICD-10-CM | POA: Insufficient documentation

## 2012-09-23 DIAGNOSIS — Z8679 Personal history of other diseases of the circulatory system: Secondary | ICD-10-CM | POA: Insufficient documentation

## 2012-09-23 MED ORDER — PREDNISONE 50 MG PO TABS
60.0000 mg | ORAL_TABLET | Freq: Once | ORAL | Status: AC
  Administered 2012-09-23: 60 mg via ORAL
  Filled 2012-09-23: qty 1

## 2012-09-23 MED ORDER — HYDROCODONE-ACETAMINOPHEN 5-325 MG PO TABS: 1.0000 | ORAL_TABLET | ORAL | Status: DC | PRN

## 2012-09-23 MED ORDER — PREDNISONE 10 MG PO TABS: ORAL_TABLET | ORAL | Status: DC

## 2012-09-23 NOTE — ED Provider Notes (Signed)
History     CSN: [id number]  Arrival date & time 09/23/12  0811   First MD Initiated Contact with Patient 09/23/12 [phone]      Chief Complaint  Patient presents with  . Back Pain    (Consider location/radiation/quality/duration/timing/severity/associated sxs/prior treatment) HPI Comments: Kristin Perry is a 37 y.o. Female presenting with acute on chronic low back pain which has which has been present for the past 2 days.   Patient denies any new injury specifically, but has been having severe coughing spells as she is getting over the flu that she was diagnosed with last week. She also describes wheezing intermittently and shortness of breath for which she is using albuterol nebs and her mdi,  Last neb was taken last night.  She denies chest pain, fevers and chills. There is no  Radiation of pan into the lower extremities.  She feels like she "pulled something" in her lower back from coughing.  She last took ibuprofen 800 mg this morning.  There has been no weakness or numbness in the lower extremities and no urinary or bowel retention or incontinence.  Patient does not have a history of cancer or IVDU.      The history is provided by the patient.    Past Medical History  Diagnosis Date  . Migraines   . Hypertension   . DEPRESSION, MAJOR, RECURRENT 09/18/2006    Qualifier: Diagnosis of  By: Bebe Shaggy    . VENEREAL WART 06/05/2009    Qualifier: Diagnosis of  By: [place], Bonnie    . MENORRHAGIA 06/27/2009    Qualifier: Diagnosis of  By: Burnadette Pop  MD, Trisha Mangle    . INCONTINENCE, STRESS, FEMALE 09/18/2006    Qualifier: Diagnosis of  By: Bebe Shaggy    . BOILS, RECURRENT 12/30/2006    Qualifier: Diagnosis of  By: Ludwig Clarks MD, [facility]      History reviewed. No pertinent past surgical history.  No family history on file.  History  Substance Use Topics  . Smoking status: Current Every Day Smoker -- 1.00 packs/day  . Smokeless tobacco: Not on file  . Alcohol Use: No     OB History   Grav Para Term Preterm Abortions TAB SAB Ect Mult Living                  Review of Systems  Constitutional: Negative for fever.  Respiratory: Positive for cough and wheezing. Negative for shortness of breath.   Cardiovascular: Negative for chest pain and leg swelling.  Gastrointestinal: Negative for abdominal pain, constipation and abdominal distention.  Genitourinary: Negative for dysuria, urgency, frequency, flank pain and difficulty urinating.  Musculoskeletal: Positive for back pain. Negative for joint swelling and gait problem.  Skin: Negative for rash.  Neurological: Negative for weakness and numbness.    Allergies  Flagyl  Home Medications   Current Outpatient Rx  Name  Route  Sig  Dispense  Refill  . albuterol (PROVENTIL HFA;VENTOLIN HFA) 108 (90 BASE) MCG/ACT inhaler   Inhalation   Inhale 2 puffs into the lungs every 6 (six) hours as needed for wheezing or shortness of breath.         Marland Kitchen aspirin-acetaminophen-caffeine (EXCEDRIN MIGRAINE) 250-250-65 MG per tablet   Oral   Take 2 tablets by mouth daily as needed.          Marland Kitchen FLUoxetine (PROZAC) 20 MG tablet   Oral   Take 1 tablet (20 mg total) by mouth daily.   90 tablet   3     **  Patient requests 90 days supply**   . hydrochlorothiazide (HYDRODIURIL) 12.5 MG tablet   Oral   Take 1 tablet (12.5 mg total) by mouth daily.   30 tablet   3   . HYDROcodone-acetaminophen (NORCO/VICODIN) 5-325 MG per tablet   Oral   Take 1 tablet by mouth every 4 (four) hours as needed for pain.   15 tablet   0   . ibuprofen (ADVIL,MOTRIN) 200 MG tablet   Oral   Take 800 mg by mouth 2 (two) times daily as needed for pain.         Marland Kitchen ondansetron (ZOFRAN ODT) 4 MG disintegrating tablet   Oral   Take 1 tablet (4 mg total) by mouth every 8 (eight) hours as needed for nausea.   20 tablet   0   . verapamil (CALAN) 80 MG tablet   Oral   Take 1 tablet (80 mg total) by mouth 3 (three) times daily.   180  tablet   3   . HYDROcodone-acetaminophen (NORCO/VICODIN) 5-325 MG per tablet   Oral   Take 1 tablet by mouth every 4 (four) hours as needed for pain.   20 tablet   0   . predniSONE (DELTASONE) 10 MG tablet      6, 5, 4, 3, 2 then 1 tablet by mouth daily for 6 days total.   21 tablet   0     BP 140/86  Pulse 78  Temp(Src) 97.4 F (36.3 C) (Oral)  Resp 18  Ht 5\' 5"  (1.651 m)  Wt 210 lb (95.255 kg)  BMI 34.95 kg/m2  SpO2 96%  LMP 09/14/2012  Physical Exam  Nursing note and vitals reviewed. Constitutional: She appears well-developed and well-nourished.  HENT:  Head: Normocephalic.  Eyes: Conjunctivae are normal.  Neck: Normal range of motion. Neck supple.  Cardiovascular: Normal rate and intact distal pulses.   Pedal pulses normal.  Pulmonary/Chest: Effort normal. No respiratory distress. She has decreased breath sounds. She has wheezes. She has no rhonchi. She has no rales.  Sporadic expiratory wheeze throughout.    Abdominal: Soft. Bowel sounds are normal. She exhibits no distension and no mass.  Musculoskeletal: Normal range of motion. She exhibits no edema.       Lumbar back: She exhibits tenderness. She exhibits no swelling, no edema and no spasm.  Neurological: She is alert. She has normal strength. She displays no atrophy and no tremor. No sensory deficit. Gait normal.  Reflex Scores:      Patellar reflexes are 2+ on the right side and 2+ on the left side.      Achilles reflexes are 2+ on the right side and 2+ on the left side. No strength deficit noted in hip and knee flexor and extensor muscle groups.  Ankle flexion and extension intact.  Skin: Skin is warm and dry.  Psychiatric: She has a normal mood and affect.    ED Course  Procedures (including critical care time)  Labs Reviewed - No data to display Dg Chest 2 View  09/23/2012  *RADIOLOGY REPORT*  Clinical Data: Coughing and wheezing for 1 week, flu last week, low back pain, pain going into both legs  with walking 2-3 days  CHEST - 2 VIEW  Comparison: 04/05/2008  Findings: Normal heart size, mediastinal contours, and pulmonary vascularity. Minimal chronic peribronchial thickening. No acute infiltrate, pleural effusion, or pneumothorax. No acute osseous findings.  IMPRESSION: Minimal chronic bronchitic changes. No acute abnormalities.   Original Report Authenticated By: Loraine Leriche  Tyron Russell, M.D.    Dg Lumbar Spine Complete  09/23/2012  *RADIOLOGY REPORT*  Clinical Data: Low back pain with pain going into both legs when walking for 2-3 days, felt pull in back when coughing  LUMBAR SPINE - COMPLETE 4+ VIEW  Comparison: None  Findings: Five non-rib bearing lumbar vertebrae. Minimal broad-based levoconvex scoliosis. Osseous mineralization normal. Vertebral body heights maintained. Disc space narrowing L4-L5 and L5-S1. Grade 1 spondylolistheses L5-S1 secondary to bilateral spondylolysis of L5. Minimal retrolisthesis L4-L5. No fracture, additional subluxation, or bone destruction. SI joints symmetric.  IMPRESSION: Grade 1 spondylolisthesis L5-S1 secondary to bilateral spondylolysis L5. Degenerative disc disease changes L4-L5 and L5-S1.   Original Report Authenticated By: Ulyses Southward, M.D.      1. Lumbar strain, initial encounter   2. Wheezing       MDM  Patients labs and/or radiological studies were reviewed during the medical decision making and disposition process. Pt given prednisone 60 mg po here.  Prescribed prednisone taper for inflammation and wheezing.  Pt to continue using her home albuterol for wheezing.  Hydrocodone prescribed for low back pain and for cough suppression.   No neuro deficit on exam or by history to suggest emergent or surgical presentation.  Also discussed worsened sx that should prompt immediate re-evaluation including distal weakness, bowel/bladder retention/incontinence.              Burgess Amor, PA-C 09/23/12 [phone]

## 2012-09-23 NOTE — ED Notes (Signed)
Pt reports had the flu last week and has been coughing a lot.  Thinks pulled a muscle in her lower back coughing.  C/O lower back pain x 2 days.

## 2012-09-24 NOTE — ED Provider Notes (Signed)
History/physical exam/procedure(s) were performed by non-physician practitioner and as supervising physician I was immediately available for consultation/collaboration. I have reviewed all notes and am in agreement with care and plan.   Hilario Quarry, MD 09/24/12 [phone]

## 2012-11-23 ENCOUNTER — Emergency Department (HOSPITAL_COMMUNITY): Payer: No Typology Code available for payment source

## 2012-11-23 ENCOUNTER — Encounter (HOSPITAL_COMMUNITY): Payer: Self-pay | Admitting: *Deleted

## 2012-11-23 ENCOUNTER — Emergency Department (HOSPITAL_COMMUNITY)
Admission: EM | Admit: 2012-11-23 | Discharge: 2012-11-23 | Disposition: A | Discharge status: Home / Self Care | Payer: No Typology Code available for payment source | Attending: Emergency Medicine | Admitting: Emergency Medicine

## 2012-11-23 DIAGNOSIS — IMO0002 Reserved for concepts with insufficient information to code with codable children: Secondary | ICD-10-CM | POA: Insufficient documentation

## 2012-11-23 DIAGNOSIS — Z8742 Personal history of other diseases of the female genital tract: Secondary | ICD-10-CM | POA: Insufficient documentation

## 2012-11-23 DIAGNOSIS — G43909 Migraine, unspecified, not intractable, without status migrainosus: Secondary | ICD-10-CM | POA: Insufficient documentation

## 2012-11-23 DIAGNOSIS — S161XXA Strain of muscle, fascia and tendon at neck level, initial encounter: Secondary | ICD-10-CM

## 2012-11-23 DIAGNOSIS — S139XXA Sprain of joints and ligaments of unspecified parts of neck, initial encounter: Secondary | ICD-10-CM | POA: Insufficient documentation

## 2012-11-23 DIAGNOSIS — S39012A Strain of muscle, fascia and tendon of lower back, initial encounter: Secondary | ICD-10-CM

## 2012-11-23 DIAGNOSIS — Z8619 Personal history of other infectious and parasitic diseases: Secondary | ICD-10-CM | POA: Insufficient documentation

## 2012-11-23 DIAGNOSIS — Z872 Personal history of diseases of the skin and subcutaneous tissue: Secondary | ICD-10-CM | POA: Insufficient documentation

## 2012-11-23 DIAGNOSIS — Y92410 Unspecified street and highway as the place of occurrence of the external cause: Secondary | ICD-10-CM | POA: Insufficient documentation

## 2012-11-23 DIAGNOSIS — S335XXA Sprain of ligaments of lumbar spine, initial encounter: Secondary | ICD-10-CM | POA: Insufficient documentation

## 2012-11-23 DIAGNOSIS — T07XXXA Unspecified multiple injuries, initial encounter: Secondary | ICD-10-CM

## 2012-11-23 DIAGNOSIS — I10 Essential (primary) hypertension: Secondary | ICD-10-CM | POA: Insufficient documentation

## 2012-11-23 DIAGNOSIS — F17200 Nicotine dependence, unspecified, uncomplicated: Secondary | ICD-10-CM | POA: Insufficient documentation

## 2012-11-23 DIAGNOSIS — F339 Major depressive disorder, recurrent, unspecified: Secondary | ICD-10-CM | POA: Insufficient documentation

## 2012-11-23 DIAGNOSIS — Z79899 Other long term (current) drug therapy: Secondary | ICD-10-CM | POA: Insufficient documentation

## 2012-11-23 DIAGNOSIS — Y9389 Activity, other specified: Secondary | ICD-10-CM | POA: Insufficient documentation

## 2012-11-23 MED ORDER — CYCLOBENZAPRINE HCL 10 MG PO TABS: 10.0000 mg | ORAL_TABLET | Freq: Two times a day (BID) | ORAL | Status: DC | PRN

## 2012-11-23 MED ORDER — OXYCODONE-ACETAMINOPHEN 5-325 MG PO TABS
1.0000 | ORAL_TABLET | Freq: Once | ORAL | Status: AC
  Administered 2012-11-23: 1 via ORAL
  Filled 2012-11-23: qty 1

## 2012-11-23 MED ORDER — IBUPROFEN 800 MG PO TABS: 800.0000 mg | ORAL_TABLET | Freq: Three times a day (TID) | ORAL | Status: DC

## 2012-11-23 MED ORDER — HYDROCODONE-ACETAMINOPHEN 5-325 MG PO TABS: ORAL_TABLET | ORAL | Status: DC
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
## 2012-11-27 NOTE — ED Provider Notes (Signed)
Medical screening examination/treatment/procedure(s) were performed by non-physician practitioner and as supervising physician I was immediately available for consultation/collaboration.  Lexia Vandevender L Ralston Venus, MD 11/27/12 1522 

## 2013-03-09 ENCOUNTER — Emergency Department (HOSPITAL_COMMUNITY)
Admission: EM | Admit: 2013-03-09 | Discharge: 2013-03-09 | Disposition: A | Discharge status: Home / Self Care | Payer: No Typology Code available for payment source | Attending: Emergency Medicine | Admitting: Emergency Medicine

## 2013-03-09 ENCOUNTER — Encounter (HOSPITAL_COMMUNITY): Payer: Self-pay | Admitting: *Deleted

## 2013-03-09 DIAGNOSIS — Z8619 Personal history of other infectious and parasitic diseases: Secondary | ICD-10-CM | POA: Insufficient documentation

## 2013-03-09 DIAGNOSIS — I10 Essential (primary) hypertension: Secondary | ICD-10-CM | POA: Insufficient documentation

## 2013-03-09 DIAGNOSIS — R110 Nausea: Secondary | ICD-10-CM | POA: Insufficient documentation

## 2013-03-09 DIAGNOSIS — Z87828 Personal history of other (healed) physical injury and trauma: Secondary | ICD-10-CM | POA: Insufficient documentation

## 2013-03-09 DIAGNOSIS — M545 Low back pain, unspecified: Secondary | ICD-10-CM | POA: Insufficient documentation

## 2013-03-09 DIAGNOSIS — Z8742 Personal history of other diseases of the female genital tract: Secondary | ICD-10-CM | POA: Insufficient documentation

## 2013-03-09 DIAGNOSIS — Z79899 Other long term (current) drug therapy: Secondary | ICD-10-CM | POA: Insufficient documentation

## 2013-03-09 DIAGNOSIS — F339 Major depressive disorder, recurrent, unspecified: Secondary | ICD-10-CM | POA: Insufficient documentation

## 2013-03-09 DIAGNOSIS — Z872 Personal history of diseases of the skin and subcutaneous tissue: Secondary | ICD-10-CM | POA: Insufficient documentation

## 2013-03-09 DIAGNOSIS — F17200 Nicotine dependence, unspecified, uncomplicated: Secondary | ICD-10-CM | POA: Insufficient documentation

## 2013-03-09 DIAGNOSIS — G43909 Migraine, unspecified, not intractable, without status migrainosus: Secondary | ICD-10-CM | POA: Insufficient documentation

## 2013-03-09 DIAGNOSIS — M542 Cervicalgia: Secondary | ICD-10-CM | POA: Insufficient documentation

## 2013-03-09 DIAGNOSIS — G8928 Other chronic postprocedural pain: Secondary | ICD-10-CM | POA: Insufficient documentation

## 2013-03-09 MED ORDER — NAPROXEN 500 MG PO TABS: 500.0000 mg | ORAL_TABLET | Freq: Two times a day (BID) | ORAL | Status: DC

## 2013-03-09 MED ORDER — CYCLOBENZAPRINE HCL 5 MG PO TABS: 5.0000 mg | ORAL_TABLET | Freq: Three times a day (TID) | ORAL | Status: DC | PRN

## 2013-03-09 MED ORDER — DEXAMETHASONE SODIUM PHOSPHATE 10 MG/ML IJ SOLN
10.0000 mg | Freq: Once | INTRAMUSCULAR | Status: AC
  Administered 2013-03-09: 10 mg via INTRAMUSCULAR
  Filled 2013-03-09: qty 1

## 2013-03-09 MED ORDER — KETOROLAC TROMETHAMINE 30 MG/ML IJ SOLN
15.0000 mg | Freq: Once | INTRAMUSCULAR | Status: AC
  Administered 2013-03-09: 15 mg via INTRAMUSCULAR
  Filled 2013-03-09: qty 1

## 2013-03-09 NOTE — ED Notes (Signed)
Pt states her legs would not work last nite but they are today
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
## 2013-03-09 NOTE — ED Notes (Signed)
Family at bedside. 
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
## 2013-03-09 NOTE — ED Provider Notes (Signed)
Medical screening examination/treatment/procedure(s) were performed by non-physician practitioner and as supervising physician I was immediately available for consultation/collaboration.    Vida Roller, MD 03/09/13 [phone]

## 2013-03-09 NOTE — ED Notes (Signed)
Pt discharged.Vital signs stable and pt is feeling much better but says she is not completely painfree.GCS 15

## 2013-03-15 ENCOUNTER — Ambulatory Visit: Payer: Self-pay | Admitting: Family Medicine

## 2013-03-17 ENCOUNTER — Other Ambulatory Visit: Payer: Self-pay | Admitting: *Deleted

## 2013-03-17 DIAGNOSIS — I10 Essential (primary) hypertension: Secondary | ICD-10-CM

## 2013-03-17 MED ORDER — VERAPAMIL HCL 80 MG PO TABS: 80.0000 mg | ORAL_TABLET | Freq: Three times a day (TID) | ORAL | Status: DC

## 2013-03-26 ENCOUNTER — Encounter: Payer: Self-pay | Admitting: Family Medicine

## 2013-03-26 ENCOUNTER — Ambulatory Visit (INDEPENDENT_AMBULATORY_CARE_PROVIDER_SITE_OTHER): Payer: Self-pay | Admitting: Family Medicine

## 2013-03-26 VITALS — BP 132/96 | HR 92 | Ht 64.0 in | Wt 223.0 lb

## 2013-03-26 DIAGNOSIS — M79605 Pain in left leg: Secondary | ICD-10-CM

## 2013-03-26 DIAGNOSIS — M545 Low back pain, unspecified: Secondary | ICD-10-CM | POA: Insufficient documentation

## 2013-03-26 DIAGNOSIS — M79604 Pain in right leg: Secondary | ICD-10-CM

## 2013-03-26 MED ORDER — HYDROCHLOROTHIAZIDE 12.5 MG PO CAPS: 25.0000 mg | ORAL_CAPSULE | Freq: Every day | ORAL | Status: DC

## 2013-03-26 MED ORDER — CYCLOBENZAPRINE HCL 5 MG PO TABS: 5.0000 mg | ORAL_TABLET | Freq: Three times a day (TID) | ORAL | Status: DC | PRN

## 2013-03-26 MED ORDER — NAPROXEN 500 MG PO TABS: 500.0000 mg | ORAL_TABLET | Freq: Two times a day (BID) | ORAL | Status: DC

## 2013-03-26 NOTE — Patient Instructions (Addendum)
Kristin Perry,  Thank you for coming in today. I have ordered the MRI. My staff will work on scheduling it. Be aware that you will receive a bill.  Please see me in f/u to review MRI.  In the meantime continue flexeril as needed, naproxen 1-2 times daily with food and low back exercise I provided.  Dr. Armen Pickup
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
## 2013-03-26 NOTE — Assessment & Plan Note (Signed)
A: low back pain with radicular features. absence of AT reflex and location of pain suggest S1 radiculopathy likely 2/2 herniated disk.  P:  MRI of back ordered. Patient is uninsured and is aware that she will receive a bill. Refilled flexeril.  Provided patient with low back exercises specifically for herniated disk to do 1-3 times daily.

## 2013-04-05 ENCOUNTER — Ambulatory Visit (HOSPITAL_COMMUNITY)
Admission: RE | Admit: 2013-04-05 | Discharge: 2013-04-05 | Disposition: A | Discharge status: Home / Self Care | Payer: No Typology Code available for payment source | Source: Ambulatory Visit | Attending: Family Medicine | Admitting: Family Medicine

## 2013-04-05 DIAGNOSIS — M79604 Pain in right leg: Secondary | ICD-10-CM

## 2013-04-05 DIAGNOSIS — Q762 Congenital spondylolisthesis: Secondary | ICD-10-CM | POA: Insufficient documentation

## 2013-04-05 DIAGNOSIS — M545 Low back pain: Secondary | ICD-10-CM

## 2013-04-05 DIAGNOSIS — M79605 Pain in left leg: Secondary | ICD-10-CM

## 2013-04-05 DIAGNOSIS — M5126 Other intervertebral disc displacement, lumbar region: Secondary | ICD-10-CM | POA: Insufficient documentation

## 2013-04-08 ENCOUNTER — Encounter: Payer: Self-pay | Admitting: Family Medicine

## 2013-04-09 ENCOUNTER — Ambulatory Visit (INDEPENDENT_AMBULATORY_CARE_PROVIDER_SITE_OTHER): Payer: Self-pay | Admitting: Family Medicine

## 2013-04-09 VITALS — BP 138/81 | HR 99 | Ht 64.0 in | Wt 221.0 lb

## 2013-04-09 DIAGNOSIS — M545 Low back pain, unspecified: Secondary | ICD-10-CM

## 2013-04-09 DIAGNOSIS — M79605 Pain in left leg: Secondary | ICD-10-CM

## 2013-04-09 DIAGNOSIS — M79604 Pain in right leg: Secondary | ICD-10-CM

## 2013-04-09 MED ORDER — MELOXICAM 15 MG PO TABS: 15.0000 mg | ORAL_TABLET | Freq: Every day | ORAL | Status: DC

## 2013-04-09 NOTE — Patient Instructions (Addendum)
Kristin Perry,  Thank you for coming back to see me today. I have reviewed your MRI.  We have reviewed your MRI together.  The next step is a referral to neurosurgery specifically a back specialist. I usually refer across the street to [facility]. Of course, I will not due this at this time since you currently have no insurance.  In the meantime. Please continue the exercises i provided. Switch from naproxen to taking mobic (meloxicam)  15 mg by mouth daily with food. This a prescription strength anti-inflammatory.  Dr. Armen Pickup

## 2013-04-12 NOTE — Progress Notes (Signed)
  Subjective:    Patient ID: Kristin Perry, female    DOB: April 16, 1976, 37 y.o.   MRN: [id number]  HPI 37 yo F presents with her daughter for f/u visit to review her low back MRI:  1. Low back pain: persist.  Pain radiates to gluteal area b/l. Pain is even is both sides. No change with flexeril and naproxen. Takes flexeril at night. Still has significant pain and cramping in the morning that last for 3 hrs or so. Denies fecal or urinary incontinence. Admits to mild LE weakness b/l.   Review of Systems As per HPI     Objective:   Physical Exam BP 138/81  Pulse 99  Ht 5\' 4"  (1.626 m)  Wt 221 lb (100.245 kg)  BMI 37.92 kg/m2 General appearance: alert, cooperative and no distress  Back Exam:  Back: Normal Curvature, no deformities or CVA tenderness  Paraspinal Tenderness: absent  LE Strength  L: 5/5 foot, 4+/5 leg extension. 5/5 hip AB and ADduction and flexion.  R: 5/5 at foot except eversion is 4+/5, 4+/5 leg extension. 5/5 hip AB and ADduction and flexion.  LE Sensation: in tact  LE Reflexes 2+ and symmetric at PT. Absent at AT.  Straight leg raise: negative. Low back pain and hamstring pain is reproducible with this maneuver.   Low back MRI 04/05/2013:   Reviewed images and report with patient.  IMPRESSION:  1. Moderate size right paracentral disc extrusion at L4-5 with  associated right L5 nerve root encroachment in the lateral recess.  2. 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     Assessment & Plan:

## 2013-04-12 NOTE — Assessment & Plan Note (Addendum)
A: persistent pain with disc extrusion at L4-L5 and b/l  pars defect at L5 resulting grade 1 anterolisthesis,  annular disc bulging and moderate biforaminal stenosis at L5-S1. Pain is consistent with MRI findings except pain does not localize more to the R.  P: The next step is a referral to neurosurgery specifically a back specialist. I usually refer across the street to [facility]. Of course, I will not due this at this time since you currently have no insurance.  In the meantime. Please continue the exercises I provided. Switch from naproxen to taking mobic (meloxicam)  15 mg by mouth daily with food. This a prescription strength anti-inflammatory.

## 2013-06-29 ENCOUNTER — Telehealth: Payer: Self-pay | Admitting: Family Medicine

## 2013-06-29 DIAGNOSIS — I10 Essential (primary) hypertension: Secondary | ICD-10-CM

## 2013-06-29 MED ORDER — VERAPAMIL HCL 80 MG PO TABS: 80.0000 mg | ORAL_TABLET | Freq: Three times a day (TID) | ORAL | Status: DC

## 2013-06-29 NOTE — Telephone Encounter (Signed)
Pt needs refill on verapamil called in to [facility] [place] # [phone]

## 2013-07-08 ENCOUNTER — Other Ambulatory Visit: Payer: Self-pay | Admitting: Family Medicine

## 2013-07-08 ENCOUNTER — Telehealth: Payer: Self-pay | Admitting: Family Medicine

## 2013-07-08 MED ORDER — FLUOXETINE HCL 20 MG PO TABS: 20.0000 mg | ORAL_TABLET | Freq: Every morning | ORAL | Status: DC

## 2013-07-08 NOTE — Telephone Encounter (Signed)
prozac refill sent in.

## 2013-07-08 NOTE — Telephone Encounter (Signed)
Refill request for FLUoxetine. Please send to [facility] on [address]

## 2013-07-08 NOTE — Telephone Encounter (Signed)
Pt is aware.  Sunita Demond,CMA  

## 2013-07-23 ENCOUNTER — Other Ambulatory Visit: Payer: Self-pay | Admitting: Family Medicine

## 2013-08-27 ENCOUNTER — Telehealth: Payer: Self-pay | Admitting: Family Medicine

## 2013-08-27 DIAGNOSIS — I10 Essential (primary) hypertension: Secondary | ICD-10-CM
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
## 2013-08-27 MED ORDER — HYDROCHLOROTHIAZIDE 25 MG PO TABS: 25.0000 mg | ORAL_TABLET | Freq: Every day | ORAL | Status: DC

## 2013-08-27 NOTE — Telephone Encounter (Signed)
Refill request for HCTZ. Please send RX to [facility] on [address]

## 2013-08-27 NOTE — Telephone Encounter (Signed)
Refill sent in

## 2013-09-10 ENCOUNTER — Other Ambulatory Visit: Payer: Self-pay | Admitting: Family Medicine

## 2013-09-22 ENCOUNTER — Telehealth: Payer: Self-pay | Admitting: Family Medicine

## 2013-09-22 DIAGNOSIS — F17200 Nicotine dependence, unspecified, uncomplicated: Secondary | ICD-10-CM

## 2013-09-22 NOTE — Telephone Encounter (Signed)
Pt called and had discuss about using Chantix and at the time she didn't want to. Now she does and would like a prescription sent in to [facility] on [address] jw

## 2013-09-24 MED ORDER — VARENICLINE TARTRATE 0.5 MG X 11 & 1 MG X 42 PO MISC: ORAL | Status: DC

## 2013-09-24 NOTE — Telephone Encounter (Signed)
Called patient back. She denies SI. Admits to smoking 1.5 PDD of Marlboro light. Ready to quit.  P: chantix starting dose pack Set quit date for 8-35 days from starting medication Plan to see me in f/u in 3-4 weeks from starting medication.

## 2013-09-24 NOTE — Assessment & Plan Note (Signed)
A: Called patient back about smoking cessation.  Admits to smoking 1.5 PDD of Marlboro light. Ready to quit. Would like chantix. Previously could not afford, now has a coupon.  She denies SI. P:chantix starting dose pack Set quit date for 8-35 days from starting medication Plan to see me in f/u in 3-4 weeks from starting medication.

## 2013-09-24 NOTE — Telephone Encounter (Signed)
LM for pt to call back and make an appt with MD. Kristin Perry

## 2013-11-22 ENCOUNTER — Other Ambulatory Visit: Payer: Self-pay | Admitting: Family Medicine

## 2013-11-22 ENCOUNTER — Telehealth: Payer: Self-pay | Admitting: Family Medicine

## 2013-11-22 NOTE — Telephone Encounter (Signed)
Refill has already been sent into the pharmacy.

## 2013-11-22 NOTE — Telephone Encounter (Signed)
Pt called and needs a refill on her hydrochlorothiazide called in to the [facility] on [address] jw

## 2013-11-29 ENCOUNTER — Ambulatory Visit (INDEPENDENT_AMBULATORY_CARE_PROVIDER_SITE_OTHER): Payer: Self-pay | Admitting: Family Medicine

## 2013-11-29 ENCOUNTER — Encounter: Payer: Self-pay | Admitting: Family Medicine

## 2013-11-29 VITALS — BP 131/85 | HR 76 | Temp 98.3°F | Ht 64.0 in | Wt 224.0 lb

## 2013-11-29 DIAGNOSIS — I10 Essential (primary) hypertension: Secondary | ICD-10-CM

## 2013-11-29 DIAGNOSIS — M545 Low back pain, unspecified: Secondary | ICD-10-CM

## 2013-11-29 DIAGNOSIS — N898 Other specified noninflammatory disorders of vagina: Secondary | ICD-10-CM

## 2013-11-29 DIAGNOSIS — F17200 Nicotine dependence, unspecified, uncomplicated: Secondary | ICD-10-CM

## 2013-11-29 DIAGNOSIS — F419 Anxiety disorder, unspecified: Secondary | ICD-10-CM

## 2013-11-29 DIAGNOSIS — M79604 Pain in right leg: Secondary | ICD-10-CM

## 2013-11-29 DIAGNOSIS — M79605 Pain in left leg: Secondary | ICD-10-CM

## 2013-11-29 DIAGNOSIS — E785 Hyperlipidemia, unspecified: Secondary | ICD-10-CM

## 2013-11-29 DIAGNOSIS — F411 Generalized anxiety disorder: Secondary | ICD-10-CM

## 2013-11-29 MED ORDER — CYCLOBENZAPRINE HCL 5 MG PO TABS: 5.0000 mg | ORAL_TABLET | Freq: Three times a day (TID) | ORAL | Status: DC | PRN

## 2013-11-29 MED ORDER — VERAPAMIL HCL 80 MG PO TABS: 80.0000 mg | ORAL_TABLET | Freq: Three times a day (TID) | ORAL | Status: DC

## 2013-11-29 MED ORDER — BORIC ACID CRYS: 600.0000 mg | CRYSTALS | Freq: Every day | Status: DC

## 2013-11-29 MED ORDER — HYDROCHLOROTHIAZIDE 12.5 MG PO CAPS: ORAL_CAPSULE | ORAL | Status: DC

## 2013-11-29 NOTE — Assessment & Plan Note (Signed)
A: smoking. P: contemplative about quitting. Declined wellbutrin and nicotine replacement. Considering being hypnotized.

## 2013-11-29 NOTE — Assessment & Plan Note (Signed)
Self weaned off meds.  States it is not a problem.

## 2013-11-29 NOTE — Assessment & Plan Note (Signed)
A: continues to smoke but BP well controlled. P: CMP (Na) Continue HCTZ and verapamil.

## 2013-11-29 NOTE — Assessment & Plan Note (Signed)
A: chronic vaginal discharge. Concern for BV. P: Refilled boric acid Wet prep at f/u for pap.

## 2013-11-29 NOTE — Progress Notes (Signed)
   Subjective:    Patient ID: Kristin Perry, female    DOB: 1975-10-09, 38 y.o.   MRN: [id number] C: HTN f/u, low back pain  HPI  1. CHRONIC HYPERTENSION  Disease Monitoring  Blood pressure range: does not check   Chest pain: no   Dyspnea: no   Claudication: no   Medication compliance: yes  Medication Side Effects  Lightheadedness: no   Urinary frequency: no   Edema: yes R ankle   Impotence: no   Preventitive Healthcare:  Exercise: yes   Diet Pattern:  Regular   Salt Restriction: yes    2. Smoking: 1 PPD. Tried chantix caused irritability. Not interested in wellbutrin. Not ready to quit, but would like to soon.  3. Vaginal discharge: chronic with odor. History of recurrent BV. Request boric acid.   4. Low back pain: improved with working with naturopath. Persistent b/l low back pain. No falls. Pain radiates down to gluteal area. Mild weakness.   HM: due for pelvic exam and pap. Deferred to f/u visit.  Review of Systems As per HPI  Denies anxiety. No longer taking SSRI.     Objective:   Physical Exam BP 131/85  Pulse 76  Temp(Src) 98.3 F (36.8 C) (Oral)  Ht 5\' 4"  (1.626 m)  Wt 224 lb (101.606 kg)  BMI 38.43 kg/m2  LMP 11/03/2013 General appearance: alert, cooperative and no distress Throat: lips, mucosa, and tongue normal; teeth and gums normal Lungs: clear to auscultation bilaterally Heart: regular rate and rhythm, S1, S2 normal, no murmur, click, rub or gallop Back Exam: Back: Normal Curvature, no deformities or CVA tenderness  Paraspinal Tenderness: absent   LE Strength 4/5  LE Sensation: in tact  LE Reflexes 2+ and symmetric  Straight leg raise: negative     Assessment & Plan:

## 2013-11-29 NOTE — Assessment & Plan Note (Signed)
A: chronic LBP previously with radiation down legs. None today. P: Refill muscle relaxer for occasional prn use.

## 2013-11-29 NOTE — Patient Instructions (Signed)
Sharnell,  Thank you for coming in today.  Please continue current regimen. I refilled boric acid. BP is great. continue exercise. Low salt diet. Avoid caffeine. Working on smoking cessation.     Please schedule f/u for mammogram in the next 1-3 weeks.  Dr. Adrian Blackwater

## 2013-11-30 LAB — COMPREHENSIVE METABOLIC PANEL
ALBUMIN: 4.1 g/dL (ref 3.5–5.2)
ALK PHOS: 70 U/L (ref 39–117)
ALT: 26 U/L (ref 0–35)
AST: 19 U/L (ref 0–37)
BUN: 8 mg/dL (ref 6–23)
CALCIUM: 9.2 mg/dL (ref 8.4–10.5)
CO2: 27 meq/L (ref 19–32)
Chloride: 102 mEq/L (ref 96–112)
Creat: 0.81 mg/dL (ref 0.50–1.10)
GLUCOSE: 96 mg/dL (ref 70–99)
POTASSIUM: 3.3 meq/L — AB (ref 3.5–5.3)
SODIUM: 136 meq/L (ref 135–145)
TOTAL PROTEIN: 6.7 g/dL (ref 6.0–8.3)
Total Bilirubin: 0.3 mg/dL (ref 0.2–1.2)

## 2013-11-30 LAB — LIPID PANEL
CHOLESTEROL: 203 mg/dL — AB (ref 0–200)
HDL: 30 mg/dL — AB (ref >39)
LDL Cholesterol: 115 mg/dL — ABNORMAL HIGH (ref 0–99)
TRIGLYCERIDES: 290 mg/dL — AB (ref <150)
Total CHOL/HDL Ratio: 6.8 Ratio
VLDL: 58 mg/dL — ABNORMAL HIGH (ref 0–40)

## 2013-12-01 ENCOUNTER — Telehealth: Payer: Self-pay | Admitting: *Deleted

## 2013-12-01 ENCOUNTER — Encounter: Payer: Self-pay | Admitting: Family Medicine

## 2013-12-01 NOTE — Telephone Encounter (Signed)
Letter mailed. Kristin Perry,CMA  

## 2013-12-01 NOTE — Telephone Encounter (Signed)
Message copied by Valerie Roys on Wed Dec 01, 2013 10:33 AM ------      Message from: Boykin Nearing      Created: Wed Dec 01, 2013 10:01 AM       Yes, the letter is in now. I sent the message a bit prematurely the letter is in now.             Thanks,            Dr. Adrian Blackwater       ----- Message -----         From: Andreas Newport, CMA         Sent: 12/01/2013   9:46 AM           To: Minerva Ends, MD            Did you want me to send this in a letter?  I see it says sent but there is no letter from today's date.  Thanks  [employer]            ----- Message -----         From: Minerva Ends, MD         Sent: 12/01/2013   9:44 AM           To: [facility]            Reviewed.      Letter sent.       Slightly low K, otherwise normal CMP.      Please increase dietary potassium with bananas, citrus juices (such as orange juice), avocados, cantaloupes, tomatoes, potatoes, lima beans, flounder, salmon, cod, chicken.      Elevated total cholesterol will need statin therapy at age 58 if she continues to smoke.                    ------

## 2014-01-26 ENCOUNTER — Other Ambulatory Visit: Payer: Self-pay | Admitting: *Deleted

## 2014-01-26 DIAGNOSIS — N898 Other specified noninflammatory disorders of vagina: Secondary | ICD-10-CM

## 2014-01-27 MED ORDER — BORIC ACID CRYS: 600.0000 mg | CRYSTALS | Freq: Every day | Status: DC

## 2014-02-03 ENCOUNTER — Telehealth: Payer: Self-pay | Admitting: Family Medicine

## 2014-02-03 NOTE — Telephone Encounter (Signed)
Refill request for Boric Acid Crystal. Please send to [facility] on [address].

## 2014-02-03 NOTE — Telephone Encounter (Signed)
Rx for Boric Acid capsules was fax'd to [facility] 02/02/14

## 2014-03-22 ENCOUNTER — Telehealth: Payer: Self-pay | Admitting: Family Medicine

## 2014-03-22 DIAGNOSIS — I10 Essential (primary) hypertension: Secondary | ICD-10-CM

## 2014-03-22 DIAGNOSIS — M79605 Pain in left leg: Secondary | ICD-10-CM

## 2014-03-22 DIAGNOSIS — M79604 Pain in right leg: Secondary | ICD-10-CM

## 2014-03-22 DIAGNOSIS — M545 Low back pain: Secondary | ICD-10-CM

## 2014-03-22 MED ORDER — HYDROCHLOROTHIAZIDE 12.5 MG PO CAPS: ORAL_CAPSULE | ORAL | Status: DC

## 2014-03-22 MED ORDER — CYCLOBENZAPRINE HCL 5 MG PO TABS: 5.0000 mg | ORAL_TABLET | Freq: Three times a day (TID) | ORAL | Status: DC | PRN

## 2014-03-22 NOTE — Telephone Encounter (Signed)
Refill request for HCTZ and Flexeril.

## 2014-06-24 ENCOUNTER — Other Ambulatory Visit: Payer: Self-pay | Admitting: Family Medicine

## 2014-06-24 DIAGNOSIS — M545 Low back pain: Secondary | ICD-10-CM

## 2014-06-24 DIAGNOSIS — I10 Essential (primary) hypertension: Secondary | ICD-10-CM

## 2014-06-24 DIAGNOSIS — M79604 Pain in right leg: Secondary | ICD-10-CM

## 2014-06-24 DIAGNOSIS — M79605 Pain in left leg: Secondary | ICD-10-CM

## 2014-06-24 NOTE — Telephone Encounter (Signed)
Pt called and needs a refill on her Flexeril, Microzide, and Calan. jw

## 2014-06-26 MED ORDER — HYDROCHLOROTHIAZIDE 12.5 MG PO CAPS: ORAL_CAPSULE | ORAL | Status: DC

## 2014-06-26 MED ORDER — VERAPAMIL HCL 80 MG PO TABS
80.0000 mg | ORAL_TABLET | Freq: Three times a day (TID) | ORAL | Status: DC
Start: 2014-06-26 — End: 2014-11-18

## 2014-06-26 MED ORDER — CYCLOBENZAPRINE HCL 5 MG PO TABS: 5.0000 mg | ORAL_TABLET | Freq: Three times a day (TID) | ORAL | Status: DC | PRN

## 2014-11-18 ENCOUNTER — Other Ambulatory Visit: Payer: Self-pay | Admitting: Family Medicine

## 2014-11-18 DIAGNOSIS — M79605 Pain in left leg: Secondary | ICD-10-CM

## 2014-11-18 DIAGNOSIS — I10 Essential (primary) hypertension: Secondary | ICD-10-CM

## 2014-11-18 DIAGNOSIS — M545 Low back pain: Secondary | ICD-10-CM

## 2014-11-18 DIAGNOSIS — M79604 Pain in right leg: Secondary | ICD-10-CM

## 2014-11-18 NOTE — Telephone Encounter (Signed)
Pt called and needs refill on her Hydrochlorothiazide, flexeril, and verapamil. jw

## 2014-11-21 MED ORDER — VERAPAMIL HCL 80 MG PO TABS: 80.0000 mg | ORAL_TABLET | Freq: Three times a day (TID) | ORAL | Status: DC

## 2014-11-21 MED ORDER — HYDROCHLOROTHIAZIDE 25 MG PO TABS: 25.0000 mg | ORAL_TABLET | Freq: Every day | ORAL | Status: DC

## 2014-11-21 NOTE — Telephone Encounter (Signed)
I am reluctant to refill prescriptions for a patient I've not met, though her HTN seems stable and she has no history of significant renal insufficiency or electrolyte derangements (very mild hypokalemia on last BMP 12 months ago). I would personally change verapamil to lisinopril for more convenient dosing. This would not significantly change the expense to the patient and may reduce it.  - I will reorder:  HCTZ 25 mg (prescribed as one tablet instead of 12.$RemoveBefor'5mg'[id number]$  x2 daily) daily verapamil $RemoveBeforeD'80mg'[id number]$  po TID  - Not refilled: flexeril. Will require an office visit for back pain evaluation with exam.

## 2015-02-07 ENCOUNTER — Other Ambulatory Visit: Payer: Self-pay | Admitting: Family Medicine

## 2015-02-07 DIAGNOSIS — N898 Other specified noninflammatory disorders of vagina: Secondary | ICD-10-CM

## 2015-02-07 NOTE — Telephone Encounter (Signed)
Pt needs to make an appointment with me. Last office visit was May 2015.

## 2015-02-07 NOTE — Telephone Encounter (Signed)
Pt requesting refill on boric acid, need to be sent to [facility]/[address], and flexeril which needs to be [facility]

## 2015-02-08 NOTE — Telephone Encounter (Signed)
Spoke to pt and informed her that she needs to make an appointment and see the Dr. before the medicine will be refilled. Pt understood and said she would call and make an appt. Ottis Stain, CMA

## 2015-02-21 ENCOUNTER — Ambulatory Visit (INDEPENDENT_AMBULATORY_CARE_PROVIDER_SITE_OTHER): Payer: Self-pay | Admitting: Family Medicine

## 2015-02-21 VITALS — BP 160/80 | HR 81 | Temp 98.4°F | Ht 64.0 in | Wt 217.7 lb

## 2015-02-21 DIAGNOSIS — F17200 Nicotine dependence, unspecified, uncomplicated: Secondary | ICD-10-CM

## 2015-02-21 DIAGNOSIS — N898 Other specified noninflammatory disorders of vagina: Secondary | ICD-10-CM

## 2015-02-21 DIAGNOSIS — E785 Hyperlipidemia, unspecified: Secondary | ICD-10-CM

## 2015-02-21 DIAGNOSIS — I10 Essential (primary) hypertension: Secondary | ICD-10-CM

## 2015-02-21 DIAGNOSIS — G43709 Chronic migraine without aura, not intractable, without status migrainosus: Secondary | ICD-10-CM

## 2015-02-21 DIAGNOSIS — Z720 Tobacco use: Secondary | ICD-10-CM

## 2015-02-21 LAB — CBC
HCT: 41.7 % (ref 36.0–46.0)
Hemoglobin: 14.7 g/dL (ref 12.0–15.0)
MCH: 32.5 pg (ref 26.0–34.0)
MCHC: 35.3 g/dL (ref 30.0–36.0)
MCV: 92.1 fL (ref 78.0–100.0)
MPV: 10.4 fL (ref 8.6–12.4)
Platelets: 252 10*3/uL (ref 150–400)
RBC: 4.53 MIL/uL (ref 3.87–5.11)
RDW: 13.4 % (ref 11.5–15.5)
WBC: 9.2 10*3/uL (ref 4.0–10.5)

## 2015-02-21 LAB — BASIC METABOLIC PANEL
BUN: 7 mg/dL (ref 7–25)
CO2: 24 mmol/L (ref 20–31)
Calcium: 9.3 mg/dL (ref 8.6–10.2)
Chloride: 104 mmol/L (ref 98–110)
Creat: 0.7 mg/dL (ref 0.50–1.10)
Glucose, Bld: 92 mg/dL (ref 65–99)
Potassium: 3.9 mmol/L (ref 3.5–5.3)
SODIUM: 136 mmol/L (ref 135–146)

## 2015-02-21 LAB — LIPID PANEL
CHOL/HDL RATIO: 7.6 ratio — AB (ref <=5.0)
Cholesterol: 214 mg/dL — ABNORMAL HIGH (ref 125–200)
HDL: 28 mg/dL — ABNORMAL LOW (ref >=46)
LDL CALC: 151 mg/dL — AB (ref <130)
TRIGLYCERIDES: 175 mg/dL — AB (ref <150)
VLDL: 35 mg/dL — ABNORMAL HIGH (ref <30)

## 2015-02-21 MED ORDER — LISINOPRIL-HYDROCHLOROTHIAZIDE 10-12.5 MG PO TABS: 1.0000 | ORAL_TABLET | Freq: Every day | ORAL | Status: DC

## 2015-02-21 MED ORDER — BACLOFEN 10 MG PO TABS: 10.0000 mg | ORAL_TABLET | Freq: Three times a day (TID) | ORAL | Status: DC

## 2015-02-21 MED ORDER — BORIC ACID CRYS: 600.0000 mg | CRYSTALS | Freq: Every day | Status: DC

## 2015-02-21 NOTE — Patient Instructions (Addendum)
- Start taking lisinopril-HCTZ 10-12.5mg  once daily.  - Start taking baclofen as needed up to 3 times daily.  - Please come back in 2 weeks just to check your blood pressure.  - High blood pressure is best treated by losing weight, taking medications as directed, avoiding salt in your diet and increasing potassium from fruits and vegetables (called the DASH diet).  If you feel faint, experience new/worsening chest pain or shortness of breath, or notice rapid leg swelling and/or weight gain you should call the clinic at [phone] OR go directly to the ER.   Take care,  - Dr. Bonner Puna  DASH Eating Plan DASH stands for "Dietary Approaches to Stop Hypertension." The DASH eating plan is a healthy eating plan that has been shown to reduce high blood pressure (hypertension). Additional health benefits may include reducing the risk of type 2 diabetes mellitus, heart disease, and stroke. The DASH eating plan may also help with weight loss. WHAT DO I NEED TO KNOW ABOUT THE DASH EATING PLAN? For the DASH eating plan, you will follow these general guidelines:  Choose foods with a percent daily value for sodium of less than 5% (as listed on the food label).  Use salt-free seasonings or herbs instead of table salt or sea salt.  Check with your health care provider or pharmacist before using salt substitutes.  Eat lower-sodium products, often labeled as "lower sodium" or "no salt added."  Eat fresh foods.  Eat more vegetables, fruits, and low-fat dairy products.  Choose whole grains. Look for the word "whole" as the first word in the ingredient list.  Choose fish and skinless chicken or [place] more often than red meat. Limit fish, poultry, and meat to 6 oz (170 g) each day.  Limit sweets, desserts, sugars, and sugary drinks.  Choose heart-healthy fats.  Limit cheese to 1 oz (28 g) per day.  Eat more home-cooked food and less restaurant, buffet, and fast food.  Limit fried foods.  Cook foods  using methods other than frying.  Limit canned vegetables. If you do use them, rinse them well to decrease the sodium.  When eating at a restaurant, ask that your food be prepared with less salt, or no salt if possible. WHAT FOODS CAN I EAT? Seek help from a dietitian for individual calorie needs. Grains Whole grain or whole wheat bread. Brown rice. Whole grain or whole wheat pasta. Quinoa, bulgur, and whole grain cereals. Low-sodium cereals. Corn or whole wheat flour tortillas. Whole grain cornbread. Whole grain crackers. Low-sodium crackers. Vegetables Fresh or frozen vegetables (raw, steamed, roasted, or grilled). Low-sodium or reduced-sodium tomato and vegetable juices. Low-sodium or reduced-sodium tomato sauce and paste. Low-sodium or reduced-sodium canned vegetables.  Fruits All fresh, canned (in natural juice), or frozen fruits. Meat and Other Protein Products Ground beef (85% or leaner), grass-fed beef, or beef trimmed of fat. Skinless chicken or [place]. Ground chicken or [place]. Pork trimmed of fat. All fish and seafood. Eggs. Dried beans, peas, or lentils. Unsalted nuts and seeds. Unsalted canned beans. Dairy Low-fat dairy products, such as skim or 1% milk, 2% or reduced-fat cheeses, low-fat ricotta or cottage cheese, or plain low-fat yogurt. Low-sodium or reduced-sodium cheeses. Fats and Oils Tub margarines without trans fats. Light or reduced-fat mayonnaise and salad dressings (reduced sodium). Avocado. Safflower, olive, or canola oils. Natural peanut or almond butter. Other Unsalted popcorn and pretzels.  WHAT FOODS ARE NOT RECOMMENDED? Grains White bread. White pasta. White rice. Refined cornbread. Bagels and croissants. Crackers that  contain trans fat. Vegetables Creamed or fried vegetables. Vegetables in a cheese sauce. Regular canned vegetables. Regular canned tomato sauce and paste. Regular tomato and vegetable juices. Fruits Dried fruits. Canned fruit in light or heavy  syrup. Fruit juice. Meat and Other Protein Products Fatty cuts of meat. Ribs, chicken wings, bacon, sausage, bologna, salami, chitterlings, fatback, hot dogs, bratwurst, and packaged luncheon meats. Salted nuts and seeds. Canned beans with salt. Dairy Whole or 2% milk, cream, half-and-half, and cream cheese. Whole-fat or sweetened yogurt. Full-fat cheeses or blue cheese. Nondairy creamers and whipped toppings. Processed cheese, cheese spreads, or cheese curds. Condiments Onion and garlic salt, seasoned salt, table salt, and sea salt. Canned and packaged gravies. Worcestershire sauce. Tartar sauce. Barbecue sauce. Teriyaki sauce. Soy sauce, including reduced sodium. Steak sauce. Fish sauce. Oyster sauce. Cocktail sauce. Horseradish. Ketchup and mustard. Meat flavorings and tenderizers. Bouillon cubes. Hot sauce. Tabasco sauce. Marinades. Taco seasonings. Relishes. Fats and Oils Butter, stick margarine, lard, shortening, ghee, and bacon fat. Coconut, palm kernel, or palm oils. Regular salad dressings. Other Pickles and olives. Salted popcorn and pretzels. The items listed above may not be a complete list of foods and beverages to avoid. Contact your dietitian for more information. WHERE CAN I FIND MORE INFORMATION? National Heart, [employer], and [employer] Institute: travelstabloid.com

## 2015-02-21 NOTE — Progress Notes (Signed)
Subjective: Kristin Perry is a 39 y.o. female here for hypertension follow up and back pain.  she doesn't check BP at home. Endorses near 100% compliance with verapamil and HCTZ. is exercising and is adherent to a low-salt diet. she is able to walk indefinitely without dyspnea. No history of SVT or AFib.   Also endorsing chronic back pain which was treated with flexeril but has been out for several months which has caused worsened pain and decreased function. No bowel/bladder problems, fever, chills, unintentional weight loss, night time awakenings secondary to pain, weakness in one or both legs  Has tried to quit smoking several times, most recently with chantix in 2014 which "did not work at all." Currently ~ 1ppd. Hasn't tried NRT but not interested at this time.   Takes excedrin migraine about 3 - 4 days per week. Had some improvement in migraine abortive therapy with imitrex when she had insurance, but cannot afford this now.   - ROS: Denies CP, SOB, palpitations, syncope, dizziness, orthopnea, PND, frequent headaches, vision changes, claudication, leg swelling. - Medications: reviewed and updated  Objective: BP 160/80 mmHg  Pulse 81  Temp(Src) 98.4 F (36.9 C) (Oral)  Ht 5\' 4"  (1.626 m)  Wt 217 lb 11.2 oz (98.748 kg)  BMI 37.35 kg/m2 Gen: Obese, Well-appearing 39 y.o.female in no distress Neck: brisk carotid upstroke, no bruits; thyroid not enlarged  Pulm: Non-labored breathing room air; CTAB CV: Regular rate with normal S1/S2, no murmur; no LE edema, no JVD; DP and radial pulses symmetric and 2+.  GI: Nontender, nondistended, no HSM   Assessment & Plan: Kristin Perry is a 40 y.o. female here for hypertension, currently uncontrolled.   See problem list for problem-specific plans.

## 2015-02-23 NOTE — Assessment & Plan Note (Signed)
vs. medication overuse headache given that she's taking excedrin ~1/2 the days of each month. Advised to try tapering down on this. Will try imitrex if she gets insurance.

## 2015-02-23 NOTE — Assessment & Plan Note (Signed)
Addressed in detail the improvement in her health that would be brought by cessation. She will let me know when she's ready to try again.

## 2015-02-23 NOTE — Assessment & Plan Note (Signed)
Benign essential HTN: Not controlled on HCTZ 25mg  and verapamil. Not sure why she's on verapamil but it was started long ago and I don't see a compelling indication, nor does she recall one.  - Add lisinopril with combination pill w/HCTZ ($[facility] at [facility] so no net change in medication costs), D/C verapamil which was >$25.  - Primary elements of DASH diet and weight loss strategies reviewed in detail. Handout given.

## 2015-02-24 ENCOUNTER — Encounter: Payer: Self-pay | Admitting: Family Medicine

## 2015-02-28 ENCOUNTER — Telehealth: Payer: Self-pay | Admitting: Family Medicine

## 2015-02-28 NOTE — Telephone Encounter (Signed)
Pt calling to request someone to call her with her recent  Lab results. Thank you, Fonda Kinder, ASA

## 2015-03-01 NOTE — Telephone Encounter (Signed)
Discussed lab results with her. Still having headaches and made appt for RN BP check next week. We agree to follow up after that and she will try to stay away from animal fats.

## 2015-03-07 ENCOUNTER — Ambulatory Visit (INDEPENDENT_AMBULATORY_CARE_PROVIDER_SITE_OTHER): Payer: Self-pay | Admitting: *Deleted

## 2015-03-07 VITALS — BP 138/88 | HR 83

## 2015-03-07 DIAGNOSIS — Z136 Encounter for screening for cardiovascular disorders: Secondary | ICD-10-CM

## 2015-03-07 DIAGNOSIS — Z0130 Encounter for examination of blood pressure without abnormal findings: Secondary | ICD-10-CM

## 2015-03-07 NOTE — Progress Notes (Signed)
BP at goal today. Will recheck at follow up, but no medication changes at this time. Thank you!

## 2015-03-07 NOTE — Progress Notes (Signed)
   Pt in nurse clinic for blood pressure check.  BP 138/88 manually, heart rate 83.  Patient denies any symptoms today. Will forward to PCP.  Derl Barrow, RN

## 2015-03-11 ENCOUNTER — Emergency Department (HOSPITAL_COMMUNITY)
Admission: EM | Admit: 2015-03-11 | Discharge: 2015-03-11 | Disposition: A | Discharge status: Home / Self Care | Payer: Self-pay | Attending: Emergency Medicine | Admitting: Emergency Medicine

## 2015-03-11 ENCOUNTER — Encounter (HOSPITAL_COMMUNITY): Payer: Self-pay | Admitting: Cardiology

## 2015-03-11 DIAGNOSIS — L0201 Cutaneous abscess of face: Secondary | ICD-10-CM | POA: Insufficient documentation

## 2015-03-11 DIAGNOSIS — I10 Essential (primary) hypertension: Secondary | ICD-10-CM | POA: Insufficient documentation

## 2015-03-11 DIAGNOSIS — Z720 Tobacco use: Secondary | ICD-10-CM | POA: Insufficient documentation

## 2015-03-11 DIAGNOSIS — Z8619 Personal history of other infectious and parasitic diseases: Secondary | ICD-10-CM | POA: Insufficient documentation

## 2015-03-11 DIAGNOSIS — Z8742 Personal history of other diseases of the female genital tract: Secondary | ICD-10-CM | POA: Insufficient documentation

## 2015-03-11 DIAGNOSIS — Z8659 Personal history of other mental and behavioral disorders: Secondary | ICD-10-CM | POA: Insufficient documentation

## 2015-03-11 DIAGNOSIS — G43909 Migraine, unspecified, not intractable, without status migrainosus: Secondary | ICD-10-CM | POA: Insufficient documentation

## 2015-03-11 MED ORDER — HYDROCODONE-ACETAMINOPHEN 5-325 MG PO TABS
1.0000 | ORAL_TABLET | Freq: Once | ORAL | Status: AC
  Administered 2015-03-11: 1 via ORAL
  Filled 2015-03-11: qty 1

## 2015-03-11 MED ORDER — SULFAMETHOXAZOLE-TRIMETHOPRIM 800-160 MG PO TABS
1.0000 | ORAL_TABLET | Freq: Once | ORAL | Status: AC
  Administered 2015-03-11: 1 via ORAL
  Filled 2015-03-11: qty 1

## 2015-03-11 MED ORDER — SULFAMETHOXAZOLE-TRIMETHOPRIM 800-160 MG PO TABS: 1.0000 | ORAL_TABLET | Freq: Two times a day (BID) | ORAL | Status: AC

## 2015-03-11 MED ORDER — HYDROCODONE-ACETAMINOPHEN 5-325 MG PO TABS: 1.0000 | ORAL_TABLET | ORAL | Status: DC | PRN

## 2015-03-11 NOTE — Discharge Instructions (Signed)
Abscess An abscess is an infected area that contains a collection of pus and debris.It can occur in almost any part of the body. An abscess is also known as a furuncle or boil. CAUSES  An abscess occurs when tissue gets infected. This can occur from blockage of oil or sweat glands, infection of hair follicles, or a minor injury to the skin. As the body tries to fight the infection, pus collects in the area and creates pressure under the skin. This pressure causes pain. People with weakened immune systems have difficulty fighting infections and get certain abscesses more often.  SYMPTOMS Usually an abscess develops on the skin and becomes a painful mass that is red, warm, and tender. If the abscess forms under the skin, you may feel a moveable soft area under the skin. Some abscesses break open (rupture) on their own, but most will continue to get worse without care. The infection can spread deeper into the body and eventually into the bloodstream, causing you to feel ill.  DIAGNOSIS  Your caregiver will take your medical history and perform a physical exam. A sample of fluid may also be taken from the abscess to determine what is causing your infection. TREATMENT  Your caregiver may prescribe antibiotic medicines to fight the infection. However, taking antibiotics alone usually does not cure an abscess. Your caregiver may need to make a small cut (incision) in the abscess to drain the pus. In some cases, gauze is packed into the abscess to reduce pain and to continue draining the area. HOME CARE INSTRUCTIONS   Only take over-the-counter or prescription medicines for pain, discomfort, or fever as directed by your caregiver.  If you were prescribed antibiotics, take them as directed. Finish them even if you start to feel better.  If gauze is used, follow your caregiver's directions for changing the gauze.  To avoid spreading the infection:  Keep your draining abscess covered with a  bandage.  Wash your hands well.  Do not share personal care items, towels, or whirlpools with others.  Avoid skin contact with others.  Keep your skin and clothes clean around the abscess.  Keep all follow-up appointments as directed by your caregiver. SEEK MEDICAL CARE IF:   You have increased pain, swelling, redness, fluid drainage, or bleeding.  You have muscle aches, chills, or a general ill feeling.  You have a fever. MAKE SURE YOU:   Understand these instructions.  Will watch your condition.  Will get help right away if you are not doing well or get worse. Document Released: 04/17/2005 Document Revised: 01/07/2012 Document Reviewed: 09/20/2011 [facility] Patient Information 2015 [place], [place]. This information is not intended to replace advice given to you by your health care provider. Make sure you discuss any questions you have with your health care provider.   Take your antibiotic as prescribed.  Use the hydrocodone if needed for pain relief.  Do not drive within 4 hours of taking this as able make you drowsy.  Apply warm compresses very frequently to your cheek to help heal this infection.  Avoid squeezing as discussed.  Return here or see your doctor for recheck if this is not improving with this treatment.  As discussed you may need to have this lanced if it does not heal with this treatment.

## 2015-03-11 NOTE — ED Provider Notes (Signed)
CSN: [id number]     Arrival date & time 03/11/15  1527 History   First MD Initiated Contact with Patient 03/11/15 1657     Chief Complaint  Patient presents with  . Abscess     (Consider location/radiation/quality/duration/timing/severity/associated sxs/prior Treatment) The history is provided by the patient.   Kristin Perry is a 39 y.o. female presenting with abscess of left mid cheek which she states started as a small pimple which she squeezed this am, obtaining purulent drainage and blood but has since become larger and more painful.  She denies fevers or chills, no other complaints.  She has used warm soaks with no improvement in her symptoms.  Reports history of occasional abscesses, new at this site.    Past Medical History  Diagnosis Date  . Migraines   . Hypertension   . DEPRESSION, MAJOR, RECURRENT 09/18/2006    Qualifier: Diagnosis of  By: Herma Ard    . VENEREAL WART 06/05/2009    Qualifier: Diagnosis of  By: Martinique, Bonnie    . MENORRHAGIA 06/27/2009    Qualifier: Diagnosis of  By: Netty Starring  MD, Lucianne Muss    . INCONTINENCE, STRESS, FEMALE 09/18/2006    Qualifier: Diagnosis of  By: Herma Ard    . BOILS, RECURRENT 12/30/2006    Qualifier: Diagnosis of  By: Mellody Drown MD, [facility]    . Anxiety 03/15/2012   Past Surgical History  Procedure Laterality Date  . Tubal ligation     History reviewed. No pertinent family history. Social History  Substance Use Topics  . Smoking status: Current Every Day Smoker -- 1.00 packs/day  . Smokeless tobacco: None  . Alcohol Use: No   OB History    No data available     Review of Systems  Constitutional: Negative for fever and chills.  Respiratory: Negative.   Gastrointestinal: Negative for nausea.  Skin:       Negative except as mentioned in HPI.    Neurological: Negative for numbness.  Hematological: Negative for adenopathy.      Allergies  Flagyl  Home Medications   Prior to Admission  medications   Medication Sig Start Date End Date Taking? Authorizing Provider  aspirin-acetaminophen-caffeine (EXCEDRIN MIGRAINE) [phone] MG per tablet Take 2 tablets by mouth daily as needed for pain.     Historical Provider, MD  baclofen (LIORESAL) 10 MG tablet Take 1 tablet (10 mg total) by mouth 3 (three) times daily. 02/21/15   Patrecia Pour, MD  Boric Acid CRYS Place 600 mg vaginally at bedtime. 02/21/15   Patrecia Pour, MD  HYDROcodone-acetaminophen (NORCO/VICODIN) 5-325 MG per tablet Take 1 tablet by mouth every 4 (four) hours as needed. 03/11/15   Evalee Jefferson, PA-C  lisinopril-hydrochlorothiazide (PRINZIDE,ZESTORETIC) 10-12.5 MG per tablet Take 1 tablet by mouth daily. 02/21/15   Patrecia Pour, MD  sulfamethoxazole-trimethoprim (BACTRIM DS,SEPTRA DS) 800-160 MG per tablet Take 1 tablet by mouth 2 (two) times daily. 03/11/15 03/18/15  Almyra Free Corrion Stirewalt, PA-C   BP 122/60 mmHg  Pulse 88  Temp(Src) 98.6 F (37 C) (Oral)  Resp 16  Ht 5\' 4"  (1.626 m)  Wt 213 lb (96.616 kg)  BMI 36.54 kg/m2  SpO2 99%  LMP 02/25/2015 Physical Exam  Constitutional: She is oriented to person, place, and time. She appears well-developed and well-nourished.  HENT:  Head: Normocephalic.  Cardiovascular: Normal rate.   Pulmonary/Chest: Effort normal.  Musculoskeletal: Normal range of motion.  Lymphadenopathy:       Head (left side): No submandibular, no tonsillar  and no preauricular adenopathy present.    She has no cervical adenopathy.  Neurological: She is alert and oriented to person, place, and time. No sensory deficit.  Skin:  2 cm induration left mid cheek.  No fluctuance, central small punctum without drainage.      ED Course  Procedures (including critical care time)  Discussed I and D vs needle aspiration, abx and warm compresses.  With no fluctuance, induration only,  We decided to use needle aspiration only over concern for facial scarring.  Performed only to assess for further purulence, performed after  skin prepped using alcohol swabs.  No further purulence identified.     Labs Review Labs Reviewed - No data to display  Imaging Review No results found. I have personally reviewed and evaluated these images and lab results as part of my medical decision-making.   EKG Interpretation None      MDM   Final diagnoses:  Facial abscess    Advised pt she may need I & D if sx persist despite abx and warm compresses.  Pt understands, advised f/u with pcp or return here prn.  Increased warm compresses.  Bactrim, hydrocodone, avoid squeezing.      Evalee Jefferson, PA-C 03/13/15 1313  Davonna Belling, MD 03/13/15 [phone]

## 2015-03-11 NOTE — ED Notes (Signed)
Boil to left side of face that started today.

## 2015-03-11 NOTE — ED Notes (Signed)
Discharge instructions reviewed with pt, reviewed antibotic usage  Importance of taking with full glass of water and completing full course- Also discussed use of warm moist compresses to area on face - also discussed wound care if area starts to drain - pt reinterated back that compresses too be done 3-4 times /day and if wound drains to cover. Also discussed pain med usage and importance of monitoring bowel movements and why .

## 2015-05-01 ENCOUNTER — Telehealth: Payer: Self-pay | Admitting: Family Medicine

## 2015-05-01 NOTE — Telephone Encounter (Signed)
Pt called because she needs a refill on her muscle spasm medication. She would like to have Flexeril again. jw

## 2015-05-02 MED ORDER — CYCLOBENZAPRINE HCL 5 MG PO TABS: 5.0000 mg | ORAL_TABLET | Freq: Three times a day (TID) | ORAL | Status: DC | PRN

## 2015-05-02 NOTE — Telephone Encounter (Signed)
Can you please have her schedule a follow up appointment to discuss muscle spasms and blood pressure? I have sent in flexeril to [facility] at [address].

## 2015-05-03 ENCOUNTER — Telehealth: Payer: Self-pay | Admitting: Family Medicine

## 2015-05-03 NOTE — Telephone Encounter (Signed)
Pt called because we sent her Flexeril to the [facility], but it was suppose to go to [facility] on [address]. Can we get this transferred since [facility] will not do it for her. jw

## 2015-05-04 NOTE — Telephone Encounter (Signed)
Spoke with patient and medication was sent to incorrect pharmacy.  I advised her to call them and ask them to transfer her script.  Pt voiced understanding.  Also she states that she hasn't received her insurance yet so will plan to make an appt once this has been done. Corryn Madewell,CMA

## 2015-06-19 ENCOUNTER — Telehealth: Payer: Self-pay | Admitting: Family Medicine

## 2015-06-19 DIAGNOSIS — I10 Essential (primary) hypertension: Secondary | ICD-10-CM

## 2015-06-19 NOTE — Telephone Encounter (Signed)
Pt called because she lost her whole bottle of BP medication and needs a new one called in. jw

## 2015-06-20 MED ORDER — LISINOPRIL-HYDROCHLOROTHIAZIDE 10-12.5 MG PO TABS: 1.0000 | ORAL_TABLET | Freq: Every day | ORAL | Status: DC

## 2015-06-20 MED ORDER — LISINOPRIL-HYDROCHLOROTHIAZIDE 10-12.5 MG PO TABS: 1.0000 | ORAL_TABLET | Freq: Every day | ORAL | Status: AC

## 2015-06-20 NOTE — Telephone Encounter (Signed)
Done. Thank you.

## 2015-06-20 NOTE — Addendum Note (Signed)
Addended by: Velora Heckler on: 06/20/2015 10:46 AM   Modules accepted: Orders

## 2015-06-20 NOTE — Telephone Encounter (Signed)
Lisinopril Rx resent to correct pharmacy: [facility] [address], RN

## 2015-06-20 NOTE — Telephone Encounter (Signed)
RX called in yesterday was to be called in to [facility] on [address]

## 2015-07-23 HISTORY — PX: BACK SURGERY: SHX140

## 2015-08-17 ENCOUNTER — Other Ambulatory Visit: Payer: Self-pay | Admitting: Family Medicine

## 2015-08-17 NOTE — Telephone Encounter (Signed)
Pt called and would like a refill on her Flexeril jw

## 2015-08-18 NOTE — Telephone Encounter (Signed)
Patient is aware of this and scheduled an appt on 08-21-15 to see Dr. Ola Spurr since pcp wasn't available until 09/08/15.  Jazmin Hartsell,CMA

## 2015-08-18 NOTE — Telephone Encounter (Signed)
Pt needs an appointment to discuss BP and muscle spasms prior to flexeril refill.

## 2015-08-21 ENCOUNTER — Ambulatory Visit: Payer: Self-pay | Admitting: Internal Medicine

## 2015-10-26 DIAGNOSIS — L732 Hidradenitis suppurativa: Secondary | ICD-10-CM | POA: Diagnosis not present

## 2015-11-02 DIAGNOSIS — M5417 Radiculopathy, lumbosacral region: Secondary | ICD-10-CM | POA: Diagnosis not present

## 2015-11-02 DIAGNOSIS — M4317 Spondylolisthesis, lumbosacral region: Secondary | ICD-10-CM | POA: Diagnosis not present

## 2015-11-02 DIAGNOSIS — Z6834 Body mass index (BMI) 34.0-34.9, adult: Secondary | ICD-10-CM | POA: Diagnosis not present

## 2015-11-02 DIAGNOSIS — M5126 Other intervertebral disc displacement, lumbar region: Secondary | ICD-10-CM | POA: Diagnosis not present

## 2015-11-22 DIAGNOSIS — I10 Essential (primary) hypertension: Secondary | ICD-10-CM | POA: Diagnosis not present

## 2016-01-17 DIAGNOSIS — M5126 Other intervertebral disc displacement, lumbar region: Secondary | ICD-10-CM | POA: Diagnosis not present

## 2016-01-30 DIAGNOSIS — Z6834 Body mass index (BMI) 34.0-34.9, adult: Secondary | ICD-10-CM | POA: Diagnosis not present

## 2016-01-30 DIAGNOSIS — M4317 Spondylolisthesis, lumbosacral region: Secondary | ICD-10-CM | POA: Diagnosis not present

## 2016-01-30 DIAGNOSIS — M5126 Other intervertebral disc displacement, lumbar region: Secondary | ICD-10-CM | POA: Diagnosis not present

## 2016-01-30 DIAGNOSIS — M5417 Radiculopathy, lumbosacral region: Secondary | ICD-10-CM | POA: Diagnosis not present

## 2016-02-12 DIAGNOSIS — M4317 Spondylolisthesis, lumbosacral region: Secondary | ICD-10-CM | POA: Diagnosis not present

## 2016-02-12 DIAGNOSIS — Z6834 Body mass index (BMI) 34.0-34.9, adult: Secondary | ICD-10-CM | POA: Diagnosis not present

## 2016-03-04 DIAGNOSIS — L723 Sebaceous cyst: Secondary | ICD-10-CM | POA: Diagnosis not present

## 2016-03-05 ENCOUNTER — Other Ambulatory Visit (HOSPITAL_COMMUNITY): Payer: Self-pay | Admitting: Neurosurgery

## 2016-03-05 DIAGNOSIS — M4317 Spondylolisthesis, lumbosacral region: Secondary | ICD-10-CM

## 2016-03-08 ENCOUNTER — Ambulatory Visit (HOSPITAL_COMMUNITY)
Admission: RE | Admit: 2016-03-08 | Discharge: 2016-03-08 | Disposition: A | Discharge status: Home / Self Care | Payer: BLUE CROSS/BLUE SHIELD | Source: Ambulatory Visit | Attending: Neurosurgery | Admitting: Neurosurgery

## 2016-03-08 DIAGNOSIS — M4317 Spondylolisthesis, lumbosacral region: Secondary | ICD-10-CM | POA: Diagnosis not present

## 2016-03-08 DIAGNOSIS — M4316 Spondylolisthesis, lumbar region: Secondary | ICD-10-CM | POA: Diagnosis not present

## 2016-03-08 DIAGNOSIS — M4807 Spinal stenosis, lumbosacral region: Secondary | ICD-10-CM | POA: Diagnosis not present

## 2016-03-12 DIAGNOSIS — Z6834 Body mass index (BMI) 34.0-34.9, adult: Secondary | ICD-10-CM | POA: Diagnosis not present

## 2016-03-12 DIAGNOSIS — M4317 Spondylolisthesis, lumbosacral region: Secondary | ICD-10-CM | POA: Diagnosis not present

## 2016-03-15 ENCOUNTER — Other Ambulatory Visit: Payer: Self-pay | Admitting: Neurosurgery

## 2016-03-26 NOTE — Pre-Procedure Instructions (Signed)
Kristin Perry  03/26/2016      [facility] ([facility] [place] [id number] [place] ([place]) [place] [address] Phone: [phone] Fax: [phone]  [facility] [place], [place] [address]  [address] Phone: [phone] Fax: [phone]    Your procedure is scheduled on Wed, Sept 13 @ 8:30 AM  Report to [facility] at 6:30 AM  Call this number if you have problems the morning of surgery:  [phone]   Remember:  Do not eat food or drink liquids after midnight.  Take these medicines the morning of surgery with A SIP OF WATER Vibramycin(Doxycycline) and Pain Pill(if needed)              Stop taking your Excedrin Migraine a week prior to surgery. No Goody's,BC's,Aleve,Advil,Motrin,Ibuprofen,Fish Oil,or any Herbal Medications.    Do not wear jewelry, make-up or nail polish.  Do not wear lotions, powders, or perfumes, or deoderant.  Do not shave 48 hours prior to surgery.    Do not bring valuables to the hospital.  [name] is not responsible for any belongings or valuables.  Contacts, dentures or bridgework may not be worn into surgery.  Leave your suitcase in the car.  After surgery it may be brought to your room.  For patients admitted to the hospital, discharge time will be determined by your treatment team.  Patients discharged the day of surgery will not be allowed to drive home.    [facility]  Before surgery, you can play an important role.  Because skin is not sterile, your skin needs to be as free of germs as possible.  You can reduce the number of germs on you skin by washing with CHG (chlorahexidine gluconate) soap before surgery.  CHG is an antiseptic cleaner which kills germs and bonds with the skin to continue killing germs even after washing.  Please DO NOT use if you  have an allergy to CHG or antibacterial soaps.  If your skin becomes reddened/irritated stop using the CHG and inform your nurse when you arrive at Short Stay.  Do not shave (including legs and underarms) for at least 48 hours prior to the first CHG shower.  You may shave your face.  Please follow these instructions carefully:   1.  Shower with CHG Soap the night before surgery and the                                morning of Surgery.  2.  If you choose to wash your hair, wash your hair first as usual with your       normal shampoo.  3.  After you shampoo, rinse your hair and body thoroughly to remove the                      Shampoo.  4.  Use CHG as you would any other liquid soap.  You can apply chg directly       to the skin and wash gently with scrungie or a clean washcloth.  5.  Apply the CHG Soap to your body ONLY FROM THE NECK DOWN.        Do not use on open wounds or open sores.  Avoid contact with  your eyes,       ears, mouth and genitals (private parts).  Wash genitals (private parts)       with your normal soap.  6.  Wash thoroughly, paying special attention to the area where your surgery        will be performed.  7.  Thoroughly rinse your body with warm water from the neck down.  8.  DO NOT shower/wash with your normal soap after using and rinsing off       the CHG Soap.  9.  Pat yourself dry with a clean towel.            10.  Wear clean pajamas.            11.  Place clean sheets on your bed the night of your first shower and do not        sleep with pets.  Day of Surgery  Do not apply any lotions/deoderants the morning of surgery.  Please wear clean clothes to the hospital/surgery center.    Please read over the following fact sheets that you were given. Pain Booklet, Coughing and Deep Breathing and MRSA Information

## 2016-03-27 ENCOUNTER — Encounter (HOSPITAL_COMMUNITY): Payer: Self-pay

## 2016-03-27 ENCOUNTER — Encounter (HOSPITAL_COMMUNITY)
Admission: RE | Admit: 2016-03-27 | Discharge: 2016-03-27 | Disposition: A | Discharge status: Home / Self Care | Payer: BLUE CROSS/BLUE SHIELD | Source: Ambulatory Visit | Attending: Neurosurgery | Admitting: Neurosurgery

## 2016-03-27 ENCOUNTER — Other Ambulatory Visit: Payer: Self-pay

## 2016-03-27 DIAGNOSIS — I10 Essential (primary) hypertension: Secondary | ICD-10-CM | POA: Diagnosis not present

## 2016-03-27 DIAGNOSIS — Z01812 Encounter for preprocedural laboratory examination: Secondary | ICD-10-CM | POA: Insufficient documentation

## 2016-03-27 DIAGNOSIS — M4317 Spondylolisthesis, lumbosacral region: Secondary | ICD-10-CM | POA: Insufficient documentation

## 2016-03-27 DIAGNOSIS — Z0183 Encounter for blood typing: Secondary | ICD-10-CM | POA: Insufficient documentation

## 2016-03-27 DIAGNOSIS — Z01818 Encounter for other preprocedural examination: Secondary | ICD-10-CM | POA: Insufficient documentation

## 2016-03-27 HISTORY — DX: Unspecified asthma, uncomplicated: J45.909

## 2016-03-27 LAB — BASIC METABOLIC PANEL
Anion gap: 8 (ref 5–15)
BUN: 7 mg/dL (ref 6–20)
CALCIUM: 9.7 mg/dL (ref 8.9–10.3)
CO2: 25 mmol/L (ref 22–32)
CREATININE: 0.79 mg/dL (ref 0.44–1.00)
Chloride: 104 mmol/L (ref 101–111)
GFR calc non Af Amer: >60 mL/min (ref >60)
Glucose, Bld: 135 mg/dL — ABNORMAL HIGH (ref 65–99)
Potassium: 3.4 mmol/L — ABNORMAL LOW (ref 3.5–5.1)
SODIUM: 137 mmol/L (ref 135–145)

## 2016-03-27 LAB — CBC
HCT: 44.1 % (ref 36.0–46.0)
Hemoglobin: 15 g/dL (ref 12.0–15.0)
MCH: 32.3 pg (ref 26.0–34.0)
MCHC: 34 g/dL (ref 30.0–36.0)
MCV: 94.8 fL (ref 78.0–100.0)
PLATELETS: 239 10*3/uL (ref 150–400)
RBC: 4.65 MIL/uL (ref 3.87–5.11)
RDW: 12.7 % (ref 11.5–15.5)
WBC: 8.3 10*3/uL (ref 4.0–10.5)

## 2016-03-27 LAB — TYPE AND SCREEN
ABO/RH(D): O POS
Antibody Screen: NEGATIVE

## 2016-03-27 LAB — HCG, SERUM, QUALITATIVE: Preg, Serum: NEGATIVE

## 2016-03-27 LAB — SURGICAL PCR SCREEN
MRSA, PCR: NEGATIVE
STAPHYLOCOCCUS AUREUS: NEGATIVE

## 2016-03-27 LAB — ABO/RH: ABO/RH(D): O POS

## 2016-04-02 ENCOUNTER — Encounter (HOSPITAL_COMMUNITY): Payer: Self-pay | Admitting: Anesthesiology

## 2016-04-02 NOTE — H&P (Signed)
Kristin Perry is an 40 y.o. female.   Chief Complaint: back pain HPI: patient who was seen by dr Christella Noa and lateron by me with a history of lumbar pain since she was 40 years old at to the point than  walking more than 2 blocks she needs to sit to get relief of the pain. Also sitting and flexing her lumbar spine helps with the pain. She has had radiological studies and it was found out that she has a l5-s1 spondylolisthesis  Past Medical History:  Diagnosis Date  . Anxiety 03/15/2012  . Asthma   . BOILS, RECURRENT 12/30/2006   Qualifier: Diagnosis of  By: Mellody Drown MD, [facility]    . DEPRESSION, MAJOR, RECURRENT 09/18/2006   Qualifier: Diagnosis of  By: Herma Ard    . Hypertension   . INCONTINENCE, STRESS, FEMALE 09/18/2006   Qualifier: Diagnosis of  By: Herma Ard    . MENORRHAGIA 06/27/2009   Qualifier: Diagnosis of  By: Netty Starring  MD, Lucianne Muss    . Migraines   . VENEREAL WART 06/05/2009   Qualifier: Diagnosis of  By: Martinique, Bonnie      Past Surgical History:  Procedure Laterality Date  . TUBAL LIGATION      No family history on file. Social History:  reports that she has been smoking.  She has been smoking about 1.00 pack per day. She does not have any smokeless tobacco history on file. She reports that she does not drink alcohol or use drugs.  Allergies:  Allergies  Allergen Reactions  . Flagyl [Metronidazole] Rash    No prescriptions prior to admission.    No results found for this or any previous visit (from the past 48 hour(s)). No results found.  Review of Systems  Constitutional: Negative.   HENT: Negative.   Eyes: Negative.   Respiratory: Negative.   Cardiovascular: Negative.   Gastrointestinal: Negative.   Genitourinary: Negative.   Musculoskeletal: Positive for back pain.  Skin: Negative.   Neurological: Positive for sensory change and focal weakness.  Endo/Heme/Allergies: Negative.   Psychiatric/Behavioral: Negative.     Last  menstrual period 03/04/2016. Physical Exam hent, nl. Neck, nl. Cv, nl. Lungs, clear. Abdomen, nl. Extremities, nl. NEURO slr positive at 60 degrees. No evidence of weakness or sensory changes.  Assessment/Plan Patient wants to proceed with decompression and fusion at l5-s1. She is aware of risks and benefits  Floyce Stakes, MD 04/02/2016, 6:14 PM

## 2016-04-02 NOTE — Anesthesia Preprocedure Evaluation (Addendum)
Anesthesia Evaluation  Patient identified by MRN, date of birth, ID band Patient awake    Reviewed: Allergy & Precautions, NPO status , Patient's Chart, lab work & pertinent test results  Airway Mallampati: III  TM Distance: >3 FB Neck ROM: Full    Dental  (+) Caps,    Pulmonary asthma , Current Smoker,    Pulmonary exam normal breath sounds clear to auscultation       Cardiovascular hypertension, Pt. on medications Normal cardiovascular exam Rhythm:Regular Rate:Normal     Neuro/Psych  Headaches, PSYCHIATRIC DISORDERS Anxiety Depression    GI/Hepatic negative GI ROS, Neg liver ROS,   Endo/Other  Obesity  Renal/GU negative Renal ROS Bladder dysfunction  SUI    Musculoskeletal Spondylolisthesis L5-S1   Abdominal   Peds  Hematology negative hematology ROS (+)   Anesthesia Other Findings   Reproductive/Obstetrics HPV                            Lab Results  Component Value Date   WBC 8.3 03/27/2016   HGB 15.0 03/27/2016   HCT 44.1 03/27/2016   MCV 94.8 03/27/2016   PLT 239 03/27/2016     Chemistry      Component Value Date/Time   NA 137 03/27/2016 1050   K 3.4 (L) 03/27/2016 1050   CL 104 03/27/2016 1050   CO2 25 03/27/2016 1050   BUN 7 03/27/2016 1050   CREATININE 0.79 03/27/2016 1050   CREATININE 0.70 02/21/2015 1555      Component Value Date/Time   CALCIUM 9.7 03/27/2016 1050   ALKPHOS 70 11/29/2013 1456   AST 19 11/29/2013 1456   AST 26 11/27/2011 1440   ALT 26 11/29/2013 1456   BILITOT 0.3 11/29/2013 1456   BILITOT 0.3 11/27/2011 1440      Anesthesia Physical Anesthesia Plan  ASA: II  Anesthesia Plan: General   Post-op Pain Management:    Induction: Intravenous  Airway Management Planned: Oral ETT  Additional Equipment:   Intra-op Plan:   Post-operative Plan: Extubation in OR  Informed Consent: I have reviewed the patients History and Physical, chart,  labs and discussed the procedure including the risks, benefits and alternatives for the proposed anesthesia with the patient or authorized representative who has indicated his/her understanding and acceptance.   Dental advisory given  Plan Discussed with: Anesthesiologist, CRNA and Surgeon  Anesthesia Plan Comments:         Anesthesia Quick Evaluation

## 2016-04-03 ENCOUNTER — Inpatient Hospital Stay (HOSPITAL_COMMUNITY): Payer: BLUE CROSS/BLUE SHIELD | Admitting: Anesthesiology

## 2016-04-03 ENCOUNTER — Encounter (HOSPITAL_COMMUNITY): Payer: Self-pay | Admitting: Certified Registered Nurse Anesthetist

## 2016-04-03 ENCOUNTER — Inpatient Hospital Stay (HOSPITAL_COMMUNITY)
Admission: RE | Admit: 2016-04-03 | Discharge: 2016-04-07 | DRG: 460 | Disposition: A | Discharge status: Home Health | Payer: BLUE CROSS/BLUE SHIELD | Source: Ambulatory Visit | Attending: Neurosurgery | Admitting: Neurosurgery

## 2016-04-03 ENCOUNTER — Encounter (HOSPITAL_COMMUNITY)
Admission: RE | Disposition: A | Discharge status: Home Health | Payer: Self-pay | Source: Ambulatory Visit | Attending: Neurosurgery

## 2016-04-03 ENCOUNTER — Inpatient Hospital Stay (HOSPITAL_COMMUNITY): Payer: BLUE CROSS/BLUE SHIELD

## 2016-04-03 DIAGNOSIS — F17200 Nicotine dependence, unspecified, uncomplicated: Secondary | ICD-10-CM | POA: Diagnosis not present

## 2016-04-03 DIAGNOSIS — M549 Dorsalgia, unspecified: Secondary | ICD-10-CM | POA: Diagnosis not present

## 2016-04-03 DIAGNOSIS — I10 Essential (primary) hypertension: Secondary | ICD-10-CM | POA: Diagnosis present

## 2016-04-03 DIAGNOSIS — M4316 Spondylolisthesis, lumbar region: Secondary | ICD-10-CM | POA: Diagnosis present

## 2016-04-03 DIAGNOSIS — Z981 Arthrodesis status: Secondary | ICD-10-CM | POA: Diagnosis not present

## 2016-04-03 DIAGNOSIS — Z419 Encounter for procedure for purposes other than remedying health state, unspecified: Secondary | ICD-10-CM

## 2016-04-03 DIAGNOSIS — M4317 Spondylolisthesis, lumbosacral region: Secondary | ICD-10-CM | POA: Diagnosis not present

## 2016-04-03 DIAGNOSIS — M5417 Radiculopathy, lumbosacral region: Secondary | ICD-10-CM | POA: Diagnosis present

## 2016-04-03 DIAGNOSIS — M6281 Muscle weakness (generalized): Secondary | ICD-10-CM

## 2016-04-03 SURGERY — POSTERIOR LUMBAR FUSION 1 LEVEL
Anesthesia: General | Site: Spine Lumbar

## 2016-04-03 MED ORDER — ROCURONIUM BROMIDE 10 MG/ML (PF) SYRINGE
PREFILLED_SYRINGE | INTRAVENOUS | Status: AC
  Filled 2016-04-03: qty 10

## 2016-04-03 MED ORDER — OXYCODONE-ACETAMINOPHEN 5-325 MG PO TABS
1.0000 | ORAL_TABLET | ORAL | Status: DC | PRN
  Administered 2016-04-03 – 2016-04-07 (×18): 2 via ORAL
  Filled 2016-04-03 (×18): qty 2

## 2016-04-03 MED ORDER — OXYCODONE-ACETAMINOPHEN 5-325 MG PO TABS
ORAL_TABLET | ORAL | Status: AC
  Filled 2016-04-03: qty 2

## 2016-04-03 MED ORDER — NALOXONE HCL 0.4 MG/ML IJ SOLN: 0.4000 mg | INTRAMUSCULAR | Status: DC | PRN

## 2016-04-03 MED ORDER — LISINOPRIL-HYDROCHLOROTHIAZIDE 10-12.5 MG PO TABS: 1.0000 | ORAL_TABLET | Freq: Every day | ORAL | Status: DC

## 2016-04-03 MED ORDER — PROPOFOL 10 MG/ML IV BOLUS
INTRAVENOUS | Status: DC | PRN
  Administered 2016-04-03: 150 mg via INTRAVENOUS
  Administered 2016-04-03: 50 mg via INTRAVENOUS

## 2016-04-03 MED ORDER — FENTANYL CITRATE (PF) 100 MCG/2ML IJ SOLN
INTRAMUSCULAR | Status: AC
  Filled 2016-04-03: qty 2

## 2016-04-03 MED ORDER — ONDANSETRON HCL 4 MG/2ML IJ SOLN
4.0000 mg | Freq: Four times a day (QID) | INTRAMUSCULAR | Status: DC | PRN
  Administered 2016-04-03: 4 mg via INTRAVENOUS

## 2016-04-03 MED ORDER — SODIUM CHLORIDE 0.9% FLUSH: 9.0000 mL | INTRAVENOUS | Status: DC | PRN

## 2016-04-03 MED ORDER — SODIUM CHLORIDE 0.9 % IV SOLN: 250.0000 mL | INTRAVENOUS | Status: DC

## 2016-04-03 MED ORDER — BUPIVACAINE LIPOSOME 1.3 % IJ SUSP
20.0000 mL | Freq: Once | INTRAMUSCULAR | Status: DC
  Filled 2016-04-03: qty 20

## 2016-04-03 MED ORDER — SUMATRIPTAN SUCCINATE 50 MG PO TABS
50.0000 mg | ORAL_TABLET | ORAL | Status: DC | PRN
  Filled 2016-04-03: qty 1

## 2016-04-03 MED ORDER — MORPHINE SULFATE 2 MG/ML IV SOLN
INTRAVENOUS | Status: DC
  Administered 2016-04-03: 13:00:00 via INTRAVENOUS

## 2016-04-03 MED ORDER — SUGAMMADEX SODIUM 200 MG/2ML IV SOLN
INTRAVENOUS | Status: AC
  Filled 2016-04-03: qty 2

## 2016-04-03 MED ORDER — CEFAZOLIN IN D5W 1 GM/50ML IV SOLN
1.0000 g | Freq: Three times a day (TID) | INTRAVENOUS | Status: AC
  Administered 2016-04-04: 1 g via INTRAVENOUS
  Filled 2016-04-03 (×2): qty 50

## 2016-04-03 MED ORDER — THROMBIN 20000 UNITS EX SOLR
CUTANEOUS | Status: DC | PRN
  Administered 2016-04-03: 10:00:00 via TOPICAL

## 2016-04-03 MED ORDER — PROMETHAZINE HCL 25 MG/ML IJ SOLN: 6.2500 mg | INTRAMUSCULAR | Status: DC | PRN

## 2016-04-03 MED ORDER — HYDROMORPHONE HCL 1 MG/ML IJ SOLN
INTRAMUSCULAR | Status: AC
  Filled 2016-04-03: qty 1

## 2016-04-03 MED ORDER — HYDROMORPHONE BOLUS VIA INFUSION
1.0000 mg | INTRAVENOUS | Status: DC | PRN
  Filled 2016-04-03: qty 1

## 2016-04-03 MED ORDER — DIPHENHYDRAMINE HCL 12.5 MG/5ML PO ELIX: 12.5000 mg | ORAL_SOLUTION | Freq: Four times a day (QID) | ORAL | Status: DC | PRN

## 2016-04-03 MED ORDER — LIDOCAINE HCL (CARDIAC) 20 MG/ML IV SOLN
INTRAVENOUS | Status: DC | PRN
  Administered 2016-04-03: 40 mg via INTRAVENOUS

## 2016-04-03 MED ORDER — SODIUM CHLORIDE 0.9% FLUSH
3.0000 mL | Freq: Two times a day (BID) | INTRAVENOUS | Status: DC
  Administered 2016-04-05 – 2016-04-07 (×4): 3 mL via INTRAVENOUS

## 2016-04-03 MED ORDER — CYCLOBENZAPRINE HCL 10 MG PO TABS
ORAL_TABLET | ORAL | Status: AC
  Filled 2016-04-03: qty 1

## 2016-04-03 MED ORDER — ACETAMINOPHEN 325 MG PO TABS: 650.0000 mg | ORAL_TABLET | ORAL | Status: DC | PRN

## 2016-04-03 MED ORDER — 0.9 % SODIUM CHLORIDE (POUR BTL) OPTIME
TOPICAL | Status: DC | PRN
  Administered 2016-04-03: 1000 mL

## 2016-04-03 MED ORDER — ONDANSETRON HCL 4 MG/2ML IJ SOLN
INTRAMUSCULAR | Status: AC
  Filled 2016-04-03: qty 2

## 2016-04-03 MED ORDER — MENTHOL 3 MG MT LOZG: 1.0000 | LOZENGE | OROMUCOSAL | Status: DC | PRN

## 2016-04-03 MED ORDER — VANCOMYCIN HCL 1000 MG IV SOLR
INTRAVENOUS | Status: DC | PRN
  Administered 2016-04-03: 1000 mg via TOPICAL

## 2016-04-03 MED ORDER — ONDANSETRON HCL 4 MG/2ML IJ SOLN
4.0000 mg | INTRAMUSCULAR | Status: DC | PRN
  Administered 2016-04-03 – 2016-04-06 (×3): 4 mg via INTRAVENOUS
  Filled 2016-04-03 (×3): qty 2

## 2016-04-03 MED ORDER — MORPHINE SULFATE 2 MG/ML IV SOLN
INTRAVENOUS | Status: AC
  Filled 2016-04-03: qty 25

## 2016-04-03 MED ORDER — DIPHENHYDRAMINE HCL 50 MG/ML IJ SOLN: 12.5000 mg | Freq: Four times a day (QID) | INTRAMUSCULAR | Status: DC | PRN

## 2016-04-03 MED ORDER — LIDOCAINE 2% (20 MG/ML) 5 ML SYRINGE
INTRAMUSCULAR | Status: AC
  Filled 2016-04-03: qty 5

## 2016-04-03 MED ORDER — CEFAZOLIN SODIUM-DEXTROSE 2-4 GM/100ML-% IV SOLN
2.0000 g | INTRAVENOUS | Status: AC
  Administered 2016-04-03: 2 g via INTRAVENOUS

## 2016-04-03 MED ORDER — SODIUM CHLORIDE 0.9% FLUSH: 3.0000 mL | INTRAVENOUS | Status: DC | PRN

## 2016-04-03 MED ORDER — ACETAMINOPHEN 650 MG RE SUPP: 650.0000 mg | RECTAL | Status: DC | PRN

## 2016-04-03 MED ORDER — CHLORHEXIDINE GLUCONATE CLOTH 2 % EX PADS: 6.0000 | MEDICATED_PAD | Freq: Once | CUTANEOUS | Status: DC

## 2016-04-03 MED ORDER — HYDROMORPHONE HCL 1 MG/ML IJ SOLN
0.5000 mg | INTRAMUSCULAR | Status: DC | PRN
  Administered 2016-04-03 – 2016-04-07 (×5): 1 mg via INTRAVENOUS
  Filled 2016-04-03 (×5): qty 1

## 2016-04-03 MED ORDER — CYCLOBENZAPRINE HCL 10 MG PO TABS
10.0000 mg | ORAL_TABLET | Freq: Three times a day (TID) | ORAL | Status: DC | PRN
Start: 2016-04-03 — End: 2016-04-07
  Administered 2016-04-03 – 2016-04-07 (×8): 10 mg via ORAL
  Filled 2016-04-03 (×7): qty 1

## 2016-04-03 MED ORDER — LACTATED RINGERS IV SOLN
INTRAVENOUS | Status: DC | PRN
  Administered 2016-04-03 (×2): via INTRAVENOUS

## 2016-04-03 MED ORDER — PHENOL 1.4 % MT LIQD: 1.0000 | OROMUCOSAL | Status: DC | PRN

## 2016-04-03 MED ORDER — FENTANYL CITRATE (PF) 100 MCG/2ML IJ SOLN: INTRAMUSCULAR | Status: DC | PRN

## 2016-04-03 MED ORDER — SENNA 8.6 MG PO TABS
1.0000 | ORAL_TABLET | Freq: Two times a day (BID) | ORAL | Status: DC
  Administered 2016-04-04 – 2016-04-07 (×7): 8.6 mg via ORAL
  Filled 2016-04-03 (×7): qty 1

## 2016-04-03 MED ORDER — FENTANYL CITRATE (PF) 100 MCG/2ML IJ SOLN
INTRAMUSCULAR | Status: DC | PRN
  Administered 2016-04-03: 100 ug via INTRAVENOUS
  Administered 2016-04-03 (×4): 50 ug via INTRAVENOUS
  Administered 2016-04-03: 100 ug via INTRAVENOUS

## 2016-04-03 MED ORDER — ONDANSETRON HCL 4 MG/2ML IJ SOLN
INTRAMUSCULAR | Status: DC | PRN
  Administered 2016-04-03: 4 mg via INTRAVENOUS

## 2016-04-03 MED ORDER — VANCOMYCIN HCL 1000 MG IV SOLR
INTRAVENOUS | Status: AC
  Filled 2016-04-03: qty 1000

## 2016-04-03 MED ORDER — MIDAZOLAM HCL 2 MG/2ML IJ SOLN
INTRAMUSCULAR | Status: AC
  Filled 2016-04-03: qty 2

## 2016-04-03 MED ORDER — MEPERIDINE HCL 25 MG/ML IJ SOLN: 6.2500 mg | INTRAMUSCULAR | Status: DC | PRN

## 2016-04-03 MED ORDER — BUPIVACAINE LIPOSOME 1.3 % IJ SUSP
INTRAMUSCULAR | Status: DC | PRN
  Administered 2016-04-03: 20 mL

## 2016-04-03 MED ORDER — DEXAMETHASONE SODIUM PHOSPHATE 10 MG/ML IJ SOLN
INTRAMUSCULAR | Status: DC | PRN
  Administered 2016-04-03: 10 mg via INTRAVENOUS

## 2016-04-03 MED ORDER — MIDAZOLAM HCL 5 MG/5ML IJ SOLN
INTRAMUSCULAR | Status: DC | PRN
  Administered 2016-04-03: 2 mg via INTRAVENOUS

## 2016-04-03 MED ORDER — CEFAZOLIN SODIUM-DEXTROSE 2-4 GM/100ML-% IV SOLN
INTRAVENOUS | Status: AC
  Filled 2016-04-03: qty 100

## 2016-04-03 MED ORDER — SODIUM CHLORIDE 0.9 % IV SOLN
INTRAVENOUS | Status: DC
  Administered 2016-04-03: 18:00:00 via INTRAVENOUS
  Administered 2016-04-06: 1 mL via INTRAVENOUS

## 2016-04-03 MED ORDER — HYDROCHLOROTHIAZIDE 12.5 MG PO CAPS
12.5000 mg | ORAL_CAPSULE | Freq: Every day | ORAL | Status: DC
  Administered 2016-04-04 – 2016-04-07 (×3): 12.5 mg via ORAL
  Filled 2016-04-03 (×4): qty 1

## 2016-04-03 MED ORDER — HYDROMORPHONE HCL 1 MG/ML IJ SOLN
0.2500 mg | INTRAMUSCULAR | Status: DC | PRN
Start: 2016-04-03 — End: 2016-04-03
  Administered 2016-04-03 (×4): 0.5 mg via INTRAVENOUS

## 2016-04-03 MED ORDER — SUGAMMADEX SODIUM 200 MG/2ML IV SOLN
INTRAVENOUS | Status: DC | PRN
  Administered 2016-04-03: 181.6 mg via INTRAVENOUS

## 2016-04-03 MED ORDER — LISINOPRIL 10 MG PO TABS
10.0000 mg | ORAL_TABLET | Freq: Every day | ORAL | Status: DC
  Administered 2016-04-04 – 2016-04-07 (×3): 10 mg via ORAL
  Filled 2016-04-03 (×4): qty 1

## 2016-04-03 MED ORDER — DEXAMETHASONE SODIUM PHOSPHATE 10 MG/ML IJ SOLN
INTRAMUSCULAR | Status: AC
  Filled 2016-04-03: qty 1

## 2016-04-03 MED ORDER — PROPOFOL 10 MG/ML IV BOLUS
INTRAVENOUS | Status: AC
  Filled 2016-04-03: qty 40

## 2016-04-03 MED ORDER — ROCURONIUM BROMIDE 100 MG/10ML IV SOLN
INTRAVENOUS | Status: DC | PRN
  Administered 2016-04-03: 30 mg via INTRAVENOUS
  Administered 2016-04-03: 20 mg via INTRAVENOUS
  Administered 2016-04-03: 50 mg via INTRAVENOUS
  Administered 2016-04-03: 30 mg via INTRAVENOUS

## 2016-04-03 MED ORDER — FENTANYL CITRATE (PF) 100 MCG/2ML IJ SOLN
INTRAMUSCULAR | Status: AC
  Filled 2016-04-03: qty 4

## 2016-04-03 SURGICAL SUPPLY — 66 items
APL SKNCLS STERI-STRIP NONHPOA (GAUZE/BANDAGES/DRESSINGS) ×1
BENZOIN TINCTURE PRP APPL 2/3 (GAUZE/BANDAGES/DRESSINGS) ×2 IMPLANT
BUR ACORN 6.0 (BURR) ×2 IMPLANT
BUR MATCHSTICK NEURO 3.0 LAGG (BURR) ×2 IMPLANT
CANISTER SUCT 3000ML PPV (MISCELLANEOUS) ×2 IMPLANT
CAP LOCKING THREADED (Cap) ×4 IMPLANT
CONT SPEC 4OZ CLIKSEAL STRL BL (MISCELLANEOUS) ×2 IMPLANT
COVER BACK TABLE 60X90IN (DRAPES) ×2 IMPLANT
CROSSLINK SPINAL 38-50MM (Neuro Prosthesis/Implant) ×1 IMPLANT
DRAPE C-ARM 42X72 X-RAY (DRAPES) ×4 IMPLANT
DRAPE LAPAROTOMY 100X72X124 (DRAPES) ×2 IMPLANT
DRAPE POUCH INSTRU U-SHP 10X18 (DRAPES) ×2 IMPLANT
DRAPE PROXIMA HALF (DRAPES) ×2 IMPLANT
DRSG OPSITE POSTOP 4X6 (GAUZE/BANDAGES/DRESSINGS) ×1 IMPLANT
DRSG PAD ABDOMINAL 8X10 ST (GAUZE/BANDAGES/DRESSINGS) IMPLANT
DURAPREP 26ML APPLICATOR (WOUND CARE) ×2 IMPLANT
ELECT BLADE 4.0 EZ CLEAN MEGAD (MISCELLANEOUS) ×2
ELECT REM PT RETURN 9FT ADLT (ELECTROSURGICAL) ×2
ELECTRODE BLDE 4.0 EZ CLN MEGD (MISCELLANEOUS) IMPLANT
ELECTRODE REM PT RTRN 9FT ADLT (ELECTROSURGICAL) ×1 IMPLANT
EVACUATOR 1/8 PVC DRAIN (DRAIN) IMPLANT
GAUZE SPONGE 4X4 12PLY STRL (GAUZE/BANDAGES/DRESSINGS) ×1 IMPLANT
GAUZE SPONGE 4X4 16PLY XRAY LF (GAUZE/BANDAGES/DRESSINGS) ×3 IMPLANT
GLOVE BIOGEL M 8.0 STRL (GLOVE) ×3 IMPLANT
GLOVE BIOGEL PI IND STRL 7.0 (GLOVE) IMPLANT
GLOVE BIOGEL PI IND STRL 7.5 (GLOVE) IMPLANT
GLOVE BIOGEL PI INDICATOR 7.0 (GLOVE) ×2
GLOVE BIOGEL PI INDICATOR 7.5 (GLOVE) ×1
GLOVE SURG SS PI 6.5 STRL IVOR (GLOVE) ×3 IMPLANT
GOWN STRL REUS W/ TWL LRG LVL3 (GOWN DISPOSABLE) ×1 IMPLANT
GOWN STRL REUS W/ TWL XL LVL3 (GOWN DISPOSABLE) IMPLANT
GOWN STRL REUS W/TWL 2XL LVL3 (GOWN DISPOSABLE) IMPLANT
GOWN STRL REUS W/TWL LRG LVL3 (GOWN DISPOSABLE) ×2
GOWN STRL REUS W/TWL XL LVL3 (GOWN DISPOSABLE) ×6
KIT BASIN OR (CUSTOM PROCEDURE TRAY) ×2 IMPLANT
KIT INFUSE MEDIUM (Orthopedic Implant) ×1 IMPLANT
KIT ROOM TURNOVER OR (KITS) ×2 IMPLANT
NDL HYPO 18GX1.5 BLUNT FILL (NEEDLE) IMPLANT
NDL HYPO 21X1.5 SAFETY (NEEDLE) IMPLANT
NDL HYPO 25X1 1.5 SAFETY (NEEDLE) IMPLANT
NEEDLE HYPO 18GX1.5 BLUNT FILL (NEEDLE) IMPLANT
NEEDLE HYPO 21X1.5 SAFETY (NEEDLE) IMPLANT
NEEDLE HYPO 25X1 1.5 SAFETY (NEEDLE) IMPLANT
NS IRRIG 1000ML POUR BTL (IV SOLUTION) ×2 IMPLANT
PACK LAMINECTOMY NEURO (CUSTOM PROCEDURE TRAY) ×2 IMPLANT
PAD ARMBOARD 7.5X6 YLW CONV (MISCELLANEOUS) ×6 IMPLANT
PATTIES SURGICAL .5 X1 (DISPOSABLE) ×1 IMPLANT
PATTIES SURGICAL .5 X3 (DISPOSABLE) IMPLANT
PATTIES SURGICAL 1X1 (DISPOSABLE) ×1 IMPLANT
ROD CREO 50MM (Rod) ×2 IMPLANT
SCREW CREO 5.5X50MM (Screw) ×2 IMPLANT
SCREW SPINE 40X5.5XPA CREO (Screw) IMPLANT
SCREW SPINE CREO 5.5X40 (Screw) ×4 IMPLANT
SPACER RISE 8X22 8-14MM-10 (Neuro Prosthesis/Implant) ×2 IMPLANT
SPONGE LAP 4X18 X RAY DECT (DISPOSABLE) IMPLANT
SPONGE NEURO XRAY DETECT 1X3 (DISPOSABLE) IMPLANT
SPONGE SURGIFOAM ABS GEL 100 (HEMOSTASIS) ×2 IMPLANT
STRIP CLOSURE SKIN 1/2X4 (GAUZE/BANDAGES/DRESSINGS) ×2 IMPLANT
SUT VIC AB 1 CT1 18XBRD ANBCTR (SUTURE) ×2 IMPLANT
SUT VIC AB 1 CT1 8-18 (SUTURE) ×2
SUT VIC AB 2-0 CP2 18 (SUTURE) ×2 IMPLANT
SUT VIC AB 3-0 SH 8-18 (SUTURE) ×2 IMPLANT
TOWEL OR 17X24 6PK STRL BLUE (TOWEL DISPOSABLE) ×2 IMPLANT
TOWEL OR 17X26 10 PK STRL BLUE (TOWEL DISPOSABLE) ×2 IMPLANT
TRAY FOLEY W/METER SILVER 16FR (SET/KITS/TRAYS/PACK) ×2 IMPLANT
WATER STERILE IRR 1000ML POUR (IV SOLUTION) ×2 IMPLANT

## 2016-04-03 NOTE — Anesthesia Postprocedure Evaluation (Signed)
Anesthesia Post Note  Patient: Kristin Perry  Procedure(s) Performed: Procedure(s) (LRB): Lumbar Five-Sacral One Posterior lumbar interbody fusion (N/A)  Patient location during evaluation: PACU Anesthesia Type: General Level of consciousness: awake and alert and oriented Pain management: pain level controlled Vital Signs Assessment: post-procedure vital signs reviewed and stable Respiratory status: spontaneous breathing, nonlabored ventilation, respiratory function stable and patient connected to nasal cannula oxygen Cardiovascular status: blood pressure returned to baseline and stable Postop Assessment: no signs of nausea or vomiting Anesthetic complications: no Comments: Patient states she feels some numbness left posterior tongue. Swallowing without difficulty. Normal phonation. Possible neuropraxia from ETT.     Last Vitals:  Vitals:   04/03/16 1227 04/03/16 1244  BP: 129/65 133/63  Pulse: 98 92  Resp: 18 10  Temp:      Last Pain:  Vitals:   04/03/16 1244  TempSrc:   PainSc: 10-Worst pain ever                 Carley Strickling A.

## 2016-04-03 NOTE — Progress Notes (Signed)
Pt OOB, in chair, awaiting 5C bed, sats 100on RAir

## 2016-04-03 NOTE — Transfer of Care (Signed)
Immediate Anesthesia Transfer of Care Note  Patient: Kristin Perry  Procedure(s) Performed: Procedure(s) with comments: Lumbar Five-Sacral One Posterior lumbar interbody fusion (N/A) - L5-S1 Posterior lumbar interbody fusion  Patient Location: PACU  Anesthesia Type:General  Level of Consciousness: awake, alert , oriented, patient cooperative and responds to stimulation  Airway & Oxygen Therapy: Patient Spontanous Breathing and Patient connected to nasal cannula oxygen  Post-op Assessment: Report given to RN, Post -op Vital signs reviewed and stable and Patient moving all extremities X 4  Post vital signs: Reviewed and stable  Last Vitals:  Vitals:   04/03/16 0725  BP: (!) 156/74  Pulse: (!) 101  Resp: 20  Temp: 37.1 C    Last Pain:  Vitals:   04/03/16 0725  TempSrc: Oral  PainSc: 7       Patients Stated Pain Goal: 3 ([id number])  Complications: No apparent anesthesia complications

## 2016-04-03 NOTE — Op Note (Signed)
NAME:  NHU, SKAHILL NO.:  [id number]  MEDICAL RECORD NO.:  [id number]  LOCATION:  [facility]                         FACILITY:  [facility]  PHYSICIAN:  Leeroy Cha, M.D.   DATE OF BIRTH:  1975-08-05  DATE OF PROCEDURE:  04/03/2016 DATE OF DISCHARGE:                              OPERATIVE REPORT   PREOPERATIVE DIAGNOSES:  L5-S1 spondylolisthesis, grade 1 and grade 2 without chronic and acute radiculopathy.  POSTOPERATIVE DIAGNOSIS:  L5-S1 spondylolisthesis grade, 1 and grade 2 without chronic and acute radiculopathy.  PROCEDURES:  Removal of the spinous process, lamina, facet of L5.  Lysis of the adhesion, bilateral L5-S1 diskectomy more than normal to introduce two expandable cages.  Pedicle screws, L5-S1.  Posterolateral arthrodesis with autograft and BMP.  Cell Saver.  C-arm.  SURGEON:  Leeroy Cha, M.D.  ASSISTANT:  Ophelia Charter, M.D.  CLINICAL HISTORY:  Ms. Krouse is a 40 year old female complaining of back pain radiation to both legs since she was 40 years old.  The pain is getting worse.  She came to my office quite miserable.  The patient was seen by a different neurosurgeon who advised surgery.  The patient decided with surgery because she was miserable.  The surgery was fully explained to her.  The patient was aware of the risks and benefits.  CT scan done plus x-rays showed that the level of both L5-S1 were normal.  DESCRIPTION OF PROCEDURE:  The patient was taken to the OR, and after intubation, she was positioned in a prone manner.  The back was cleaned with DuraPrep and drapes were applied.  X-ray showed that indeed I was at the level of L5-S1.  The patient had quite a large adipose tissue. Midline incision was made through the skin through a thick adipose tissue down to the fascia.  We dissected the muscle and we were able to visualize easily L5-S1.  The posterior arch was quite loose.  We went ahead with removal of the spinous process,  the lamina and the facet of L5.  The patient had quite a bit of adhesion, lysis was accomplished. We retracted the thecal sac first on the right side and then on the left side and using microdissection, we were able to get into the disk space up to the point.  Then at the end after we did a total diskectomy bilaterally more than normal, we were able to introduce two cages, which were expanded to 16-17 mm.  The cages had BMP and autograft and the rest of the disk space was filled up with same material.  Then, using the C- arm first in AP view and then in lateral view, we made holes in the pedicle of L5-S1.  We went ahead and introduced the pedicle screws. Prior to that, we feel all four quadrants to be sure that we were surrounded by bone.  Two pedicle screws of 5.5 x 40 were introduced at the level of L5 and two of 5.5 x 50 at the level of S1.  The screws were connected with rods and kept in place with Capps.  We went laterally and we removed the periosteum of the lateral facet of L5 and the ala of the  sacrum, and a mix of BMP and autograft was used for arthrodesis.  The area was irrigated.  Valsalva maneuver was negative.  We went back again to look at the forearm just to be sure that the nerve was completely free of any bone or metal.  Then, vancomycin powder was left in the epidural space and the wound was closed with Vicryl and Steri-Strip.          ______________________________ Leeroy Cha, M.D.     EB/MEDQ  D:  04/03/2016  T:  04/03/2016  Job:  [id number]

## 2016-04-03 NOTE — Progress Notes (Signed)
Pt arrived to 5C17 via stretcher.  Pt alert and oriented, complaints of pain a soreness.  VSS.  Will continue to monitor.  Cori Razor, RN

## 2016-04-03 NOTE — Anesthesia Procedure Notes (Signed)
Procedure Name: Intubation Date/Time: 04/03/2016 8:48 AM Performed by: Tressia Miners LEFFEW Pre-anesthesia Checklist: Patient identified, Patient being monitored, Timeout performed, Emergency Drugs available and Suction available Patient Re-evaluated:Patient Re-evaluated prior to inductionOxygen Delivery Method: Circle System Utilized Preoxygenation: Pre-oxygenation with 100% oxygen Intubation Type: IV induction Ventilation: Mask ventilation without difficulty Laryngoscope Size: Mac and 3 Grade View: Grade I Tube type: Oral Tube size: 7.5 mm Number of attempts: 1 Airway Equipment and Method: Stylet Placement Confirmation: ETT inserted through vocal cords under direct vision,  positive ETCO2 and breath sounds checked- equal and bilateral Secured at: 23 cm Tube secured with: Tape Dental Injury: Teeth and Oropharynx as per pre-operative assessment

## 2016-04-04 MED ORDER — DIPHENHYDRAMINE HCL 50 MG/ML IJ SOLN: 12.5000 mg | Freq: Four times a day (QID) | INTRAMUSCULAR | Status: DC | PRN

## 2016-04-04 MED ORDER — DIPHENHYDRAMINE HCL 12.5 MG/5ML PO ELIX: 12.5000 mg | ORAL_SOLUTION | Freq: Four times a day (QID) | ORAL | Status: DC | PRN

## 2016-04-04 MED ORDER — MANAGING BACK PAIN BOOK
Freq: Once | Status: AC
  Administered 2016-04-04: 08:00:00
  Filled 2016-04-04: qty 1

## 2016-04-04 MED ORDER — NALOXONE HCL 0.4 MG/ML IJ SOLN: 0.4000 mg | INTRAMUSCULAR | Status: DC | PRN

## 2016-04-04 MED ORDER — SODIUM CHLORIDE 0.9% FLUSH: 9.0000 mL | INTRAVENOUS | Status: DC | PRN

## 2016-04-04 MED ORDER — FENTANYL 40 MCG/ML IV SOLN
INTRAVENOUS | Status: DC
  Administered 2016-04-04: 12:00:00 via INTRAVENOUS
  Administered 2016-04-04: 105 ug via INTRAVENOUS
  Administered 2016-04-04: 165 ug via INTRAVENOUS
  Administered 2016-04-04: 240 ug via INTRAVENOUS
  Administered 2016-04-05: 256 ug via INTRAVENOUS
  Administered 2016-04-05: 195 ug via INTRAVENOUS
  Administered 2016-04-05: 155 ug via INTRAVENOUS
  Administered 2016-04-05: 06:00:00 via INTRAVENOUS
  Administered 2016-04-05: 195 ug via INTRAVENOUS
  Administered 2016-04-06: 40 ug via INTRAVENOUS
  Administered 2016-04-06: 120 ug via INTRAVENOUS
  Administered 2016-04-06: 0 ug via INTRAVENOUS
  Administered 2016-04-06: 150 ug via INTRAVENOUS
  Filled 2016-04-04 (×3): qty 25

## 2016-04-04 MED ORDER — ONDANSETRON HCL 4 MG/2ML IJ SOLN: 4.0000 mg | Freq: Four times a day (QID) | INTRAMUSCULAR | Status: DC | PRN

## 2016-04-04 MED FILL — Sodium Chloride IV Soln 0.9%: INTRAVENOUS | Qty: 1000 | Status: AC

## 2016-04-04 MED FILL — Heparin Sodium (Porcine) Inj 1000 Unit/ML: INTRAMUSCULAR | Qty: 30 | Status: AC

## 2016-04-04 NOTE — Progress Notes (Signed)
Patient ID: Kristin Perry, female   DOB: 11/21/1975, 40 y.o.   MRN: [id number] C/o incisional pain, no weakness. Sensory normal. Had one episode of vomoting last night. pca morphine was discontiniued

## 2016-04-04 NOTE — Progress Notes (Signed)
OT Cancellation Note  Patient Details Name: Kristin Perry MRN: [id number] DOB: 1976/01/26   Cancelled Treatment:    Reason Eval/Treat Not Completed: Pain limiting ability to participate. Pt with 9/10 back pain and just returned to bed. Will reattempt OT eval as schedule permits.   Hortencia Pilar 04/04/2016, 1:17 PM

## 2016-04-04 NOTE — Evaluation (Signed)
Physical Therapy Evaluation Patient Details Name: Kristin Perry MRN: [id number] DOB: 1976-05-06 Today's Date: 04/04/2016   History of Present Illness  Patient is a 41 y/o female with hx of migraines, HTN, depression and anxiety presents s/p L5-S2 PLIF.  Clinical Impression  Patient presents with pain, weakness and post surgical deficits s/p above surgery. Tolerated transfers and gait training with Min A for balance/safety. Pt reaching for rail for support so will try RW next session for stability. Pt has family coming to stay with her for 3 weeks at discharge. Education re: back precautions, positioning, brace, activity progression etc. Will follow acutely to maximize independence and mobility prior to return home.    Follow Up Recommendations Home health PT;Supervision for mobility/OOB    Equipment Recommendations  Rolling walker with 5" wheels    Recommendations for Other Services       Precautions / Restrictions Precautions Precautions: Back Precaution Booklet Issued: No Precaution Comments: Reviewed back precautions Required Braces or Orthoses: Spinal Brace Spinal Brace: Lumbar corset Restrictions Weight Bearing Restrictions: No      Mobility  Bed Mobility Overal bed mobility: Needs Assistance Bed Mobility: Rolling;Sidelying to Sit Rolling: Min guard Sidelying to sit: Min guard       General bed mobility comments: HOB flat, no use of rails to simulate home. Cues for log roll technique. Some difficulty.   Transfers Overall transfer level: Needs assistance Equipment used: None Transfers: Sit to/from Stand Sit to Stand: Min guard         General transfer comment: Min guard to steady in standing as pt walking UEs up thighs to get to upright. Transferred to chair post ambulation.  Ambulation/Gait Ambulation/Gait assistance: Min assist Ambulation Distance (Feet): 120 Feet Assistive device: None (rail) Gait Pattern/deviations: Step-through pattern;Decreased  stride length;Wide base of support;Trunk flexed Gait velocity: decreased Gait velocity interpretation: Below normal speed for age/gender General Gait Details: Slow, unsteady gait with bil knee instability noted. Reaching for rail for support. Will attempt RW next session for stability.  Stairs            Wheelchair Mobility    Modified Rankin (Stroke Patients Only)       Balance Overall balance assessment: Needs assistance Sitting-balance support: Feet supported;No upper extremity supported Sitting balance-Leahy Scale: Fair     Standing balance support: During functional activity Standing balance-Leahy Scale: Fair                               Pertinent Vitals/Pain Pain Assessment: 0-10 Pain Score: 8  Pain Location: back at incision Pain Descriptors / Indicators: Operative site guarding;Sore Pain Intervention(s): Monitored during session;Repositioned;Limited activity within patient's tolerance    Home Living Family/patient expects to be discharged to:: Private residence Living Arrangements: Spouse/significant other;Parent Available Help at Discharge: Family;Available 24 hours/day Type of Home: House Home Access: Stairs to enter Entrance Stairs-Rails: None Entrance Stairs-Number of Steps: 4 Home Layout: One level Home Equipment: None      Prior Function Level of Independence: Independent         Comments: Works as Network engineer.     Hand Dominance        Extremity/Trunk Assessment   Upper Extremity Assessment: Defer to OT evaluation           Lower Extremity Assessment: Generalized weakness      Cervical / Trunk Assessment: Other exceptions  Communication   Communication: No difficulties  Cognition Arousal/Alertness: Awake/alert Behavior During Therapy: [facility]  for tasks assessed/performed Overall Cognitive Status: Within Functional Limits for tasks assessed                      General Comments General comments (skin  integrity, edema, etc.): Visitors arrived during session.    Exercises        Assessment/Plan    PT Assessment Patient needs continued PT services  PT Diagnosis Difficulty walking;Acute pain;Generalized weakness   PT Problem List Decreased strength;Decreased mobility;Decreased knowledge of precautions;Decreased activity tolerance;Pain;Decreased knowledge of use of DME;Decreased balance  PT Treatment Interventions DME instruction;Therapeutic activities;Gait training;Therapeutic exercise;Patient/family education;Balance training;Stair training;Functional mobility training   PT Goals (Current goals can be found in the Care Plan section) Acute Rehab PT Goals Patient Stated Goal: be able to walk without pain PT Goal Formulation: With patient Time For Goal Achievement: 04/18/16 Potential to Achieve Goals: Good    Frequency Min 5X/week   Barriers to discharge Inaccessible home environment 4 stairs without rail to get into home.    Co-evaluation               End of Session Equipment Utilized During Treatment: Gait belt;Back brace Activity Tolerance: Patient tolerated treatment well;Patient limited by pain Patient left: in chair;with call bell/phone within reach;with family/visitor present Nurse Communication: Mobility status         Time: 1025-1046 PT Time Calculation (min) (ACUTE ONLY): 21 min   Charges:   PT Evaluation $PT Eval Moderate Complexity: 1 Procedure     PT G Codes:        Tjuana Vickrey A Calieb Lichtman 04/04/2016, 10:57 AM [name], PT, DPT [phone]

## 2016-04-05 NOTE — Progress Notes (Signed)
Since at least start of this rn shift, PCA pump has been turned off. Pt did not have IV access. Pt now has IV access. IV tubing changed her IV team request.

## 2016-04-05 NOTE — Progress Notes (Signed)
Pt assisted to chair.

## 2016-04-05 NOTE — Progress Notes (Signed)
Assisted pt from chair back to bed.

## 2016-04-05 NOTE — Progress Notes (Signed)
Pt stated she was still in pain, rn gave pt muscle relaxer. Pt asking for her "other medication". rn asked what medication this was pt stated percocet. rn explained that often if a pt is on a PCA pump, PO narcotics are not given often. Pt stated that pt has been getting PCA pump and percocet and for rn to look in the chart. rn stated that she thought pt was getting the percocet bc the PCA pump had not been working/ no IV access. Pt stated that the PCA pump has not been used for just 1 day. rn verbalized she understood. rn told pt to give it 30 minutes and if pt was still in pain to call out and asked for the percocet, but that rn did not want to give to many medications at once.

## 2016-04-05 NOTE — Progress Notes (Signed)
Physical Therapy Treatment Patient Details Name: Kristin Perry MRN: [id number] DOB: Apr 18, 1976 Today's Date: 04/05/2016    History of Present Illness Patient is a 40 y/o female with hx of migraines, HTN, depression and anxiety presents s/p L5-S2 PLIF.    PT Comments    Patient progressing well towards PT goals. Balance and gait much improved with use of RW for support. Continues to exhibit pain, weakness in BLEs and fatigue. Reviewed back precautions. Pt with elevated HR during mobility to 130 bpm. Will plan for stair training tomorrow as tolerated. Will continue to follow.   Follow Up Recommendations  Home health PT;Supervision for mobility/OOB     Equipment Recommendations  Rolling walker with 5" wheels;3in1 (PT)    Recommendations for Other Services       Precautions / Restrictions Precautions Precautions: Back Precaution Booklet Issued: No Precaution Comments: Reviewed back precautions Required Braces or Orthoses: Spinal Brace Spinal Brace: Lumbar corset Restrictions Weight Bearing Restrictions: No    Mobility  Bed Mobility Overal bed mobility: Needs Assistance Bed Mobility: Sit to Sidelying         Sit to sidelying: Min assist General bed mobility comments: HOB flat, no use of rails to simulate home. Cues for log roll technique. Some difficulty bringing BLEs into bed.  Transfers Overall transfer level: Needs assistance Equipment used: Rolling walker (2 wheeled) Transfers: Sit to/from Stand Sit to Stand: Min guard         General transfer comment: Min guard to steady in standing as pt walking UEs up thighs to get to upright. Stood from chair x1, from [employer].  Ambulation/Gait Ambulation/Gait assistance: Min assist Ambulation Distance (Feet): 150 Feet Assistive device: Rolling walker (2 wheeled) Gait Pattern/deviations: Step-through pattern;Decreased stride length;Trunk flexed Gait velocity: decreased Gait velocity interpretation: Below normal speed  for age/gender General Gait Details: Slow, mildly unsteady gait with RW for support. Mild bil knee instability noted but no buckling. 2 standing rest breaks. Fatigues. HR up to 130 bpm during mobility.   Stairs            Wheelchair Mobility    Modified Rankin (Stroke Patients Only)       Balance Overall balance assessment: Needs assistance Sitting-balance support: Feet supported;No upper extremity supported Sitting balance-Leahy Scale: Fair     Standing balance support: During functional activity Standing balance-Leahy Scale: Fair                      Cognition Arousal/Alertness: Awake/alert Behavior During Therapy: WFL for tasks assessed/performed Overall Cognitive Status: Within Functional Limits for tasks assessed                      Exercises      General Comments General comments (skin integrity, edema, etc.): Mother, fiance and daughter present during session.      Pertinent Vitals/Pain Pain Assessment: Faces Faces Pain Scale: Hurts even more Pain Location: back at incision Pain Descriptors / Indicators: Sore;Operative site guarding Pain Intervention(s): Monitored during session;Repositioned    Home Living                      Prior Function            PT Goals (current goals can now be found in the care plan section) Progress towards PT goals: Progressing toward goals    Frequency   PT Diagnosis  Min 5X/week   Muscle weakness (generalized)  Surgery, elective  PT Plan Current plan remains appropriate    Co-evaluation             End of Session Equipment Utilized During Treatment: Gait belt;Back brace Activity Tolerance: Patient tolerated treatment well;Patient limited by pain Patient left: in bed;with call bell/phone within reach;with bed alarm set;with family/visitor present     Time: [id number] PT Time Calculation (min) (ACUTE ONLY): 20 min  Charges:  $Gait Training: 8-22 mins                     G Codes:      Kristin Perry A Kristin Perry 04/05/2016, 12:03 PM Kristin Perry, [place], DPT [phone]

## 2016-04-05 NOTE — Progress Notes (Signed)
rn talked with pt about PCA pump, pt said she was not pushing it every time the green light on the button lit up. rn asked pt what her pain level was. Pt said 8/10. rn instructed pt to push the button for the PCA pump whenever the light was green with the goal of trying to get pts pain down to a 5/10.

## 2016-04-05 NOTE — Progress Notes (Signed)
Pt assisted to the bedside commode

## 2016-04-06 NOTE — Progress Notes (Signed)
Marissa, rn witnessed waste of 18.6ml fentanyl from pts PCA pump

## 2016-04-06 NOTE — Evaluation (Signed)
Occupational Therapy Evaluation/Discharge Patient Details Name: Kristin Perry MRN: [id number] DOB: 1976/02/27 Today's Date: 04/06/2016    History of Present Illness Patient is a 40 y/o female with hx of migraines, HTN, depression and anxiety presents s/p L5-S2 PLIF.   Clinical Impression   PTA, pt was independent with ADLs and mobility. Pt currently requires min assist for LB ADLs and supervision for basic transfers due to acute back pain and resulting mild balance deficits. Reviewed back precautions, brace wear protocol, log roll technique for bed mobility, positioning for sleep/rest, compensatory strategies for LB ADLs, home safety, and fall prevention strategies. Also educated pt to change positions frequently during the day and to only sit for 30 minutes at a time. Pt demonstrated good adherence to all back precautions and pt/family demonstrated understanding of all compensatory strategies. All education has been completed and pt has no further questions. Pt with no further acute OT needs. OT signing off.    Follow Up Recommendations  No OT follow up;Supervision - Intermittent    Equipment Recommendations  3 in 1 bedside comode    Recommendations for Other Services       Precautions / Restrictions Precautions Precautions: Back Precaution Booklet Issued: Yes (comment) Precaution Comments: Pt able to recall 3/3 back precautions. Provided handout for more information on precautions during functional activities. Required Braces or Orthoses: Spinal Brace Spinal Brace: Lumbar corset;Applied in sitting position Restrictions Weight Bearing Restrictions: No      Mobility Bed Mobility               General bed mobility comments: Pt up in chair on OT arrival. Pt verbalized understanding of log roll technique.  Transfers Overall transfer level: Needs assistance Equipment used: Rolling walker (2 wheeled) Transfers: Sit to/from Stand Sit to Stand: Supervision Stand pivot  transfers: Supervision       General transfer comment: Supervision for safety. Good demonstration of safe hand placement, no physical assistance required.    Balance Overall balance assessment: Needs assistance Sitting-balance support: No upper extremity supported;Feet supported Sitting balance-Leahy Scale: Good Sitting balance - Comments: Able to donn PJs sitting in chair maintaining back precautions.   Standing balance support: No upper extremity supported;During functional activity Standing balance-Leahy Scale: Fair Standing balance comment: able to maintain balance without UE support for static standing tasks                            ADL Overall ADL's : Needs assistance/impaired Eating/Feeding: Independent;Sitting   Grooming: Wash/dry hands;Wash/dry face;Supervision/safety;Standing   Upper Body Bathing: Supervision/ safety;Sitting   Lower Body Bathing: Minimal assistance;Sit to/from stand Lower Body Bathing Details (indicate cue type and reason): educated on use of long-handled sponge; mother will assist as needed Upper Body Dressing : Supervision/safety;Sitting   Lower Body Dressing: Minimal assistance;Sit to/from stand Lower Body Dressing Details (indicate cue type and reason): unable to cross ankle-over-knee without pain; mother will assist Toilet Transfer: Supervision/safety;Ambulation;BSC;RW   Toileting- Clothing Manipulation and Hygiene: Supervision/safety;Sit to/from stand Toileting - Clothing Manipulation Details (indicate cue type and reason): educated on use of wet wipes Tub/ Shower Transfer: Tub transfer;Min guard;Ambulation;3 in 1;Rolling walker   Functional mobility during ADLs: Min guard;Rolling walker General ADL Comments: Educated pt on back precautions, positioning for sleep/rest, gradual activity progression, brace wear protocol, and compensatory strategies for ADLs. Also educated on fall prevention and home safety strategies. Pt's husband and  mother present for OT eval.     Vision Vision Assessment?: No  apparent visual deficits   Perception     Praxis      Pertinent Vitals/Pain Pain Assessment: 0-10 Pain Score: 5  Pain Location: back Pain Descriptors / Indicators: Aching;Sore Pain Intervention(s): Monitored during session;Limited activity within patient's tolerance;Repositioned;Premedicated before session     Hand Dominance Right   Extremity/Trunk Assessment Upper Extremity Assessment Upper Extremity Assessment: Overall WFL for tasks assessed   Lower Extremity Assessment Lower Extremity Assessment: Defer to PT evaluation   Cervical / Trunk Assessment Cervical / Trunk Assessment: Other exceptions Cervical / Trunk Exceptions: s/p lumbar surgery   Communication Communication Communication: No difficulties   Cognition Arousal/Alertness: Awake/alert Behavior During Therapy: WFL for tasks assessed/performed Overall Cognitive Status: Within Functional Limits for tasks assessed                     General Comments       Exercises       Shoulder Instructions      Home Living Family/patient expects to be discharged to:: Private residence Living Arrangements: Spouse/significant other;Parent Available Help at Discharge: Family;Available 24 hours/day Type of Home: House Home Access: Stairs to enter [employer] of Steps: 4 Entrance Stairs-Rails: None Home Layout: One level     Bathroom Shower/Tub: Tub/shower unit Shower/tub characteristics: Architectural technologist: Standard     Home Equipment: None          Prior Functioning/Environment Level of Independence: Independent        Comments: Works as Network engineer.        OT Problem List: Decreased activity tolerance;Impaired balance (sitting and/or standing);Decreased safety awareness;Decreased knowledge of use of DME or AE;Pain   OT Treatment/Interventions:      OT Goals(Current goals can be found in the care plan section)  Acute Rehab OT Goals Patient Stated Goal: be able to walk without pain OT Goal Formulation: With patient Time For Goal Achievement: 04/20/16 Potential to Achieve Goals: Good  OT Frequency:     Barriers to D/C:            Co-evaluation              End of Session Equipment Utilized During Treatment: Rolling walker;Back brace Nurse Communication: Mobility status  Activity Tolerance: Patient tolerated treatment well Patient left: in chair;with call bell/phone within reach;with family/visitor present   Time: [id number] OT Time Calculation (min): 18 min Charges:  OT General Charges $OT Visit: 1 Procedure OT Evaluation $OT Eval Low Complexity: 1 Procedure G-Codes:    Redmond Baseman, OTR/L [phone] 04/06/2016, 1:53 PM

## 2016-04-06 NOTE — Progress Notes (Signed)
Physical Therapy Treatment Patient Details Name: Kristin Perry MRN: [id number] DOB: Feb 28, 1976 Today's Date: 04/06/2016    History of Present Illness Patient is a 40 y/o female with hx of migraines, HTN, depression and anxiety presents s/p L5-S2 PLIF.    PT Comments    Patient reports nausea today. + emesis after getting up to chair. Tolerated stair training and gait training with Min guard assist for safety. PCA pump discharged this morning and pt now on PO meds. Having difficulty with pain management and reporting pain in BLEs. Agreeable to mobility. Will continue to follow.   Follow Up Recommendations  Home health PT;Supervision for mobility/OOB     Equipment Recommendations  Rolling walker with 5" wheels;3in1 (PT)    Recommendations for Other Services       Precautions / Restrictions Precautions Precautions: Back Precaution Booklet Issued: No Precaution Comments: Reviewed back precautions Required Braces or Orthoses: Spinal Brace Spinal Brace: Lumbar corset Restrictions Weight Bearing Restrictions: No    Mobility  Bed Mobility Overal bed mobility: Needs Assistance Bed Mobility: Rolling;Sidelying to Sit;Sit to Sidelying Rolling: Supervision Sidelying to sit: Supervision     Sit to sidelying: Supervision General bed mobility comments: HOB flat, no use of rails to simulate home. Cues for log roll technique.   Transfers Overall transfer level: Needs assistance Equipment used: Rolling walker (2 wheeled) Transfers: Stand Pivot Transfers Sit to Stand: Supervision Stand pivot transfers: Supervision       General transfer comment: Supervision for safety. Stood multiple times from chair and bed due to changing gowns. + nausea sitting in chair. SPT chair to bed.   Ambulation/Gait Ambulation/Gait assistance: Min guard Ambulation Distance (Feet): 200 Feet Assistive device: Rolling walker (2 wheeled) Gait Pattern/deviations: Step-through pattern;Decreased stride  length Gait velocity: decreased Gait velocity interpretation: Below normal speed for age/gender General Gait Details: Slow, mildly unsteady gait with RW for support. Left knee instability noted but no buckling.    Stairs Stairs: Yes Stairs assistance: Min guard Stair Management: Step to pattern;One rail Left Number of Stairs: 3 (+ 2 steps x2 bouts) General stair comments: Cues for technique and safety.   Wheelchair Mobility    Modified Rankin (Stroke Patients Only)       Balance Overall balance assessment: Needs assistance Sitting-balance support: Feet supported;No upper extremity supported Sitting balance-Leahy Scale: Good Sitting balance - Comments: Able to donn PJs sitting in chair maintaining back precautions.   Standing balance support: During functional activity Standing balance-Leahy Scale: Fair Standing balance comment: Able to stand statically without UE support.                    Cognition Arousal/Alertness: Awake/alert Behavior During Therapy: WFL for tasks assessed/performed Overall Cognitive Status: Within Functional Limits for tasks assessed                      Exercises      General Comments General comments (skin integrity, edema, etc.): Pt with + nausea and emesis; changed gown and helped pt get cleaned up.      Pertinent Vitals/Pain Pain Assessment: 0-10 Pain Score: 5  Pain Location: back at incision and BLEs Pain Descriptors / Indicators: Sore;Operative site guarding;Aching Pain Intervention(s): Monitored during session;Repositioned    Home Living                      Prior Function            PT Goals (current goals can now be  found in the care plan section) Progress towards PT goals: Progressing toward goals    Frequency    Min 5X/week      PT Plan Current plan remains appropriate    Co-evaluation             End of Session Equipment Utilized During Treatment: Gait belt;Back brace Activity  Tolerance: Patient tolerated treatment well;Patient limited by pain Patient left: in bed;with call bell/phone within reach;with bed alarm set     Time: 0821-0846 PT Time Calculation (min) (ACUTE ONLY): 25 min  Charges:  $Gait Training: 8-22 mins $Therapeutic Activity: 8-22 mins                    G Codes:      Jazlynne Milliner A Siddh Vandeventer 04/06/2016, 10:13 AM [name], [place], DPT [phone]

## 2016-04-06 NOTE — Progress Notes (Signed)
Honeycomb dressing stained, rn was going to change dressing, started to remove honeycomb, but steristrips were sticking to honeycomb. rn reapplied honeycomb, but then reinforced the original honeycomb with 2 new honeycomb dressings so that original dressing would stay in place.   Pt given icepack for incision site.

## 2016-04-06 NOTE — Progress Notes (Signed)
Patient ID: Kristin Perry, female   DOB: 03-Jan-1976, 40 y.o.   MRN: [id number] Subjective: Patient reports "feeling rough"  Objective: Vital signs in last 24 hours: Temp:  [97.3 F (36.3 C)-99 F (37.2 C)] 98.2 F (36.8 C) (09/16 0532) Pulse Rate:  [79-96] 79 (09/16 0532) Resp:  [10-20] 20 (09/16 0532) BP: (88-117)/(51-70) 107/57 (09/16 0532) SpO2:  [95 %-100 %] 95 % (09/16 0532)  Intake/Output from previous day: 09/15 0701 - 09/16 0700 In: 3635.3 [P.O.:580; I.V.:3055.3] Out: -  Intake/Output this shift: Total I/O In: 3155.3 [P.O.:100; I.V.:3055.3] Out: -   Neurologic: Grossly normal  Lab Results: Lab Results  Component Value Date   WBC 8.3 03/27/2016   HGB 15.0 03/27/2016   HCT 44.1 03/27/2016   MCV 94.8 03/27/2016   PLT 239 03/27/2016   No results found for: INR, PROTIME BMET Lab Results  Component Value Date   NA 137 03/27/2016   K 3.4 (L) 03/27/2016   CL 104 03/27/2016   CO2 25 03/27/2016   GLUCOSE 135 (H) 03/27/2016   BUN 7 03/27/2016   CREATININE 0.79 03/27/2016   CALCIUM 9.7 03/27/2016    Studies/Results: No results found.  Assessment/Plan: Stop PCA, home when no longer needing IV pain meds   LOS: 3 days    Carel Schnee S 04/06/2016, 6:47 AM

## 2016-04-06 NOTE — Progress Notes (Signed)
Pt was working with PT. PT rolled in recliner down the hall. Pt thinks this made her nauseas. Pt vomited small amount. Given zofran.

## 2016-04-07 MED ORDER — HYDROCODONE-ACETAMINOPHEN 10-325 MG PO TABS: 1.0000 | ORAL_TABLET | ORAL | 0 refills | Status: DC | PRN

## 2016-04-07 MED ORDER — CYCLOBENZAPRINE HCL 10 MG PO TABS: 10.0000 mg | ORAL_TABLET | Freq: Three times a day (TID) | ORAL | 1 refills | Status: DC

## 2016-04-07 NOTE — Progress Notes (Signed)
Doing well Moving legs well Stable D/c

## 2016-04-07 NOTE — Progress Notes (Signed)
Pt noted ambulating with rolling walker brace on and aligned in the hallway. Gait steady. No noted distress. Pain medication administered for comfort.

## 2016-04-07 NOTE — Care Management Note (Signed)
Case Management Note  Patient Details  Name: Britta Burgoon MRN: [id number] Date of Birth: 02-28-1976  Subjective/Objective:                  s/p L5-S2 PLIF  Action/Plan: Cm spoke to patient and she [facility]. Cm called Jermaine with [facility] and referral accepted. Patient said that RW and 3N1 already delivered to her bedside. No further CM needs communicated at this time.   Expected Discharge Date:  04/04/16               Expected Discharge Plan:  [place]  In-House Referral:     Discharge planning Services  CM Consult  Post Acute Care Choice:  Home Health Choice offered to:  Patient  DME Arranged:  3-N-1, Walker rolling DME Agency:  [place]:  PT [place]:  [place]  Status of Service:  Completed, signed off  If discussed at [place] of Stay Meetings, dates discussed:    Additional Comments:  Guido Sander, RN 04/07/2016, 11:17 AM

## 2016-04-07 NOTE — Progress Notes (Signed)
Pt discharging at this time with family taking all personal belongings. Surgical dressing changed, dry and intact. Discharge instructions provided with prescription with verbal understanding.  Pt aware of follow up appts. No noted distress.

## 2016-04-07 NOTE — Discharge Summary (Signed)
Date of Admission: 04/03/2016  Date of Discharge: 04/07/16  PREOPERATIVE DIAGNOSES:  L5-S1 spondylolisthesis, grade 1 and grade 2 without chronic and acute radiculopathy.  POSTOPERATIVE DIAGNOSIS:  L5-S1 spondylolisthesis grade, 1 and grade 2 without chronic and acute radiculopathy.  PROCEDURES:  Removal of the spinous process, lamina, facet of L5.  Lysis of the adhesion, bilateral L5-S1 diskectomy more than normal to introduce two expandable cages.  Pedicle screws, L5-S1.  Posterolateral arthrodesis with autograft and BMP.   Attending: Leeroy Cha, MD  Hospital Course:  The patient was admitted for the above listed operation and had an uncomplicated post-operative course.  They were discharged in stable condition.  Follow up: 3 weeks    Medication List    TAKE these medications   aspirin-acetaminophen-caffeine 250-250-65 MG tablet Commonly known as:  EXCEDRIN MIGRAINE Take 2 tablets by mouth daily as needed for pain.   cyclobenzaprine 10 MG tablet Commonly known as:  FLEXERIL Take 1 tablet (10 mg total) by mouth 3 (three) times daily. What changed:  when to take this  reasons to take this   doxycycline 100 MG capsule Commonly known as:  VIBRAMYCIN Take 1 capsule by mouth 2 (two) times daily.   HYDROcodone-acetaminophen 10-325 MG tablet Commonly known as:  NORCO Take 1 tablet by mouth every 4 (four) hours as needed.   lisinopril-hydrochlorothiazide 10-12.5 MG tablet Commonly known as:  PRINZIDE,ZESTORETIC Take 1 tablet by mouth daily.   SUMAtriptan 50 MG tablet Commonly known as:  IMITREX Take 50 mg by mouth every 2 (two) hours as needed for migraine. May repeat in 2 hours if headache persists or recurs.

## 2016-04-08 DIAGNOSIS — M5416 Radiculopathy, lumbar region: Secondary | ICD-10-CM | POA: Diagnosis not present

## 2016-04-08 DIAGNOSIS — Z4789 Encounter for other orthopedic aftercare: Secondary | ICD-10-CM | POA: Diagnosis not present

## 2016-04-08 DIAGNOSIS — I10 Essential (primary) hypertension: Secondary | ICD-10-CM | POA: Diagnosis not present

## 2016-04-08 DIAGNOSIS — M4316 Spondylolisthesis, lumbar region: Secondary | ICD-10-CM | POA: Diagnosis not present

## 2016-04-10 DIAGNOSIS — M5416 Radiculopathy, lumbar region: Secondary | ICD-10-CM | POA: Diagnosis not present

## 2016-04-10 DIAGNOSIS — M4316 Spondylolisthesis, lumbar region: Secondary | ICD-10-CM | POA: Diagnosis not present

## 2016-04-10 DIAGNOSIS — I10 Essential (primary) hypertension: Secondary | ICD-10-CM | POA: Diagnosis not present

## 2016-04-10 DIAGNOSIS — Z4789 Encounter for other orthopedic aftercare: Secondary | ICD-10-CM | POA: Diagnosis not present

## 2016-04-15 ENCOUNTER — Encounter (HOSPITAL_COMMUNITY): Payer: Self-pay | Admitting: Emergency Medicine

## 2016-04-15 ENCOUNTER — Emergency Department (HOSPITAL_COMMUNITY)
Admission: EM | Admit: 2016-04-15 | Discharge: 2016-04-15 | Disposition: A | Discharge status: Home / Self Care | Payer: BLUE CROSS/BLUE SHIELD | Attending: Emergency Medicine | Admitting: Emergency Medicine

## 2016-04-15 DIAGNOSIS — Z4801 Encounter for change or removal of surgical wound dressing: Secondary | ICD-10-CM | POA: Diagnosis not present

## 2016-04-15 DIAGNOSIS — Z79899 Other long term (current) drug therapy: Secondary | ICD-10-CM | POA: Diagnosis not present

## 2016-04-15 DIAGNOSIS — M96830 Postprocedural hemorrhage and hematoma of a musculoskeletal structure following a musculoskeletal system procedure: Secondary | ICD-10-CM | POA: Diagnosis not present

## 2016-04-15 DIAGNOSIS — J45909 Unspecified asthma, uncomplicated: Secondary | ICD-10-CM | POA: Insufficient documentation

## 2016-04-15 DIAGNOSIS — I10 Essential (primary) hypertension: Secondary | ICD-10-CM | POA: Insufficient documentation

## 2016-04-15 DIAGNOSIS — Z7982 Long term (current) use of aspirin: Secondary | ICD-10-CM | POA: Diagnosis not present

## 2016-04-15 DIAGNOSIS — M4316 Spondylolisthesis, lumbar region: Secondary | ICD-10-CM | POA: Diagnosis not present

## 2016-04-15 DIAGNOSIS — Z4789 Encounter for other orthopedic aftercare: Secondary | ICD-10-CM | POA: Diagnosis not present

## 2016-04-15 DIAGNOSIS — M5416 Radiculopathy, lumbar region: Secondary | ICD-10-CM | POA: Diagnosis not present

## 2016-04-15 DIAGNOSIS — F17200 Nicotine dependence, unspecified, uncomplicated: Secondary | ICD-10-CM | POA: Diagnosis not present

## 2016-04-15 DIAGNOSIS — Z5189 Encounter for other specified aftercare: Secondary | ICD-10-CM

## 2016-04-15 DIAGNOSIS — Z4800 Encounter for change or removal of nonsurgical wound dressing: Secondary | ICD-10-CM | POA: Diagnosis not present

## 2016-04-15 NOTE — ED Provider Notes (Signed)
[facility] DEPT Provider Note   CSN: [id number] Arrival date & time: 04/15/16  1659  By signing my name below, I, [name], attest that this documentation has been prepared under the direction and in the presence of  Etta Quill, NP. Electronically Signed: [name], ED Scribe. 04/15/16. 7:33 PM.   History   Chief Complaint Chief Complaint  Patient presents with  . Post-op Problem    The history is provided by the patient. No language interpreter was used.   HPI Comments:  Kristin Perry is a 40 y.o. female, with a PSHx of a Lumbar Five-Sacral One PLIF, who presents to the Emergency Department complaining of controlled bleeding from an incision on her lower back onset 1 day ago. Pt notes her surgery was performed on 04/03/16. She notes minor itching to the incision site. She denies any fevers. No alleviating factors noted. Pt denies any other complaints at this time.   Past Medical History:  Diagnosis Date  . Anxiety 03/15/2012  . Asthma   . BOILS, RECURRENT 12/30/2006   Qualifier: Diagnosis of  By: Mellody Drown MD, [facility]    . DEPRESSION, MAJOR, RECURRENT 09/18/2006   Qualifier: Diagnosis of  By: Herma Ard    . Hypertension   . INCONTINENCE, STRESS, FEMALE 09/18/2006   Qualifier: Diagnosis of  By: Herma Ard    . MENORRHAGIA 06/27/2009   Qualifier: Diagnosis of  By: Netty Starring  MD, Lucianne Muss    . Migraines   . VENEREAL WART 06/05/2009   Qualifier: Diagnosis of  By: Martinique, Bonnie      Patient Active Problem List   Diagnosis Date Noted  . Spondylolisthesis of lumbar region 04/03/2016  . Vaginal discharge 11/29/2013  . Low back pain 03/26/2013  . HYPERTENSION, BENIGN ESSENTIAL 06/05/2009  . Hyperlipidemia 11/07/2006  . OBESITY, NOS 09/18/2006  . TOBACCO DEPENDENCE 09/18/2006  . Migraine 09/18/2006    Past Surgical History:  Procedure Laterality Date  . TUBAL LIGATION      OB History    No data available       Home Medications    Prior to  Admission medications   Medication Sig Start Date End Date Taking? Authorizing Provider  aspirin-acetaminophen-caffeine (EXCEDRIN MIGRAINE) [phone] MG per tablet Take 2 tablets by mouth daily as needed for pain.     Historical Provider, MD  cyclobenzaprine (FLEXERIL) 10 MG tablet Take 1 tablet (10 mg total) by mouth 3 (three) times daily. 04/07/16   Kevan Ny Ditty, MD  doxycycline (VIBRAMYCIN) 100 MG capsule Take 1 capsule by mouth 2 (two) times daily. 03/15/16   Historical Provider, MD  HYDROcodone-acetaminophen (NORCO) 10-325 MG tablet Take 1 tablet by mouth every 4 (four) hours as needed. 04/07/16   Kevan Ny Ditty, MD  lisinopril-hydrochlorothiazide (PRINZIDE,ZESTORETIC) 10-12.5 MG tablet Take 1 tablet by mouth daily. 06/20/15   Patrecia Pour, MD  SUMAtriptan (IMITREX) 50 MG tablet Take 50 mg by mouth every 2 (two) hours as needed for migraine. May repeat in 2 hours if headache persists or recurs.    Historical Provider, MD    Family History History reviewed. No pertinent family history.  Social History Social History  Substance Use Topics  . Smoking status: Current Every Day Smoker    Packs/day: 1.00  . Smokeless tobacco: Never Used  . Alcohol use No     Allergies   Flagyl [metronidazole]   Review of Systems Review of Systems  Constitutional: Negative for fever.  Skin: Positive for wound.  All other systems reviewed and  are negative.    Physical Exam Updated Vital Signs BP (!) 130/106 (BP Location: Right Arm)   Pulse 111   Temp 98.7 F (37.1 C) (Oral)   Resp 18   LMP 03/04/2016 Comment: tubes tied  SpO2 100%   Physical Exam  Constitutional: She is oriented to person, place, and time. She appears well-developed and well-nourished. No distress.  HENT:  Head: Normocephalic and atraumatic.  Eyes: Conjunctivae are normal.  Neck: Normal range of motion.  Cardiovascular: Normal rate.   Pulmonary/Chest: Effort normal.  Abdominal: Soft. She exhibits no  distension.  Neurological: She is alert and oriented to person, place, and time.  Skin: Skin is warm and dry.  Surgical wound noted in lower lumbar spine area. Dressing is intact. Some blood is noted on gauze. No tenderness. No extension of bruising beyond dressing. See picture below.   Psychiatric: She has a normal mood and affect.  Nursing note and vitals reviewed.      ED Treatments / Results  DIAGNOSTIC STUDIES:  Oxygen Saturation is 100% on RA, normal by my interpretation.    COORDINATION OF CARE:  7:26 PM Discussed treatment plan with pt at bedside and pt agreed to plan.  Labs (all labs ordered are listed, but only abnormal results are displayed) Labs Reviewed - No data to display  EKG  EKG Interpretation None       Radiology No results found.  Procedures Procedures (including critical care time)  Medications Ordered in ED Medications - No data to display   Initial Impression / Assessment and Plan / ED Course  I have reviewed the triage vital signs and the nursing notes.  Pertinent labs & imaging results that were available during my care of the patient were reviewed by me and considered in my medical decision making (see chart for details).  Clinical Course  Patient preents for check of surgical incision. The dressing is intact with a small amount of blood noted on the dressing. No active bleeding.  Afebrile and hemodynamically stable. Pt is instructed to follow up with her surgeon tomorrow. Pt has a good understanding of return precautions and is safe for discharge at this time.    Final Clinical Impressions(s) / ED Diagnoses   Final diagnoses:  Visit for wound check    New Prescriptions New Prescriptions   No medications on file  I personally performed the services described in this documentation, which was scribed in my presence. The recorded information has been reviewed and is accurate.     Etta Quill, NP 04/16/16 [id number]    Julianne Rice, MD 04/17/16 [phone]

## 2016-04-15 NOTE — ED Triage Notes (Signed)
Pt here with some increased bleeding from incision on lower back; pt sent by PT

## 2016-04-18 DIAGNOSIS — M5416 Radiculopathy, lumbar region: Secondary | ICD-10-CM | POA: Diagnosis not present

## 2016-04-18 DIAGNOSIS — I10 Essential (primary) hypertension: Secondary | ICD-10-CM | POA: Diagnosis not present

## 2016-04-18 DIAGNOSIS — Z4789 Encounter for other orthopedic aftercare: Secondary | ICD-10-CM | POA: Diagnosis not present

## 2016-04-18 DIAGNOSIS — M4316 Spondylolisthesis, lumbar region: Secondary | ICD-10-CM | POA: Diagnosis not present

## 2016-04-23 DIAGNOSIS — I10 Essential (primary) hypertension: Secondary | ICD-10-CM | POA: Diagnosis not present

## 2016-04-23 DIAGNOSIS — M5416 Radiculopathy, lumbar region: Secondary | ICD-10-CM | POA: Diagnosis not present

## 2016-04-23 DIAGNOSIS — Z4789 Encounter for other orthopedic aftercare: Secondary | ICD-10-CM | POA: Diagnosis not present

## 2016-04-23 DIAGNOSIS — M4316 Spondylolisthesis, lumbar region: Secondary | ICD-10-CM | POA: Diagnosis not present

## 2016-04-26 DIAGNOSIS — Z4789 Encounter for other orthopedic aftercare: Secondary | ICD-10-CM | POA: Diagnosis not present

## 2016-04-26 DIAGNOSIS — M5416 Radiculopathy, lumbar region: Secondary | ICD-10-CM | POA: Diagnosis not present

## 2016-04-26 DIAGNOSIS — M4316 Spondylolisthesis, lumbar region: Secondary | ICD-10-CM | POA: Diagnosis not present

## 2016-04-26 DIAGNOSIS — I10 Essential (primary) hypertension: Secondary | ICD-10-CM | POA: Diagnosis not present

## 2016-05-02 DIAGNOSIS — M4317 Spondylolisthesis, lumbosacral region: Secondary | ICD-10-CM | POA: Diagnosis not present

## 2016-06-06 DIAGNOSIS — M4317 Spondylolisthesis, lumbosacral region: Secondary | ICD-10-CM | POA: Diagnosis not present

## 2016-06-20 DIAGNOSIS — L723 Sebaceous cyst: Secondary | ICD-10-CM | POA: Diagnosis not present

## 2016-08-26 ENCOUNTER — Other Ambulatory Visit: Payer: Self-pay | Admitting: Family Medicine

## 2016-08-26 ENCOUNTER — Other Ambulatory Visit (HOSPITAL_COMMUNITY)
Admission: RE | Admit: 2016-08-26 | Discharge: 2016-08-26 | Disposition: A | Discharge status: Home / Self Care | Payer: Medicaid Other | Source: Ambulatory Visit | Attending: Family Medicine | Admitting: Family Medicine

## 2016-08-26 DIAGNOSIS — Z01419 Encounter for gynecological examination (general) (routine) without abnormal findings: Secondary | ICD-10-CM | POA: Insufficient documentation

## 2016-08-28 LAB — CYTOLOGY - PAP: Diagnosis: NEGATIVE

## 2017-01-05 IMAGING — CR DG LUMBAR SPINE 1V
1 series · 1 of 1 positions shown · non-contrast
Comparison: None.

CLINICAL DATA: Intraoperative localization

EXAM:
LUMBAR SPINE - 1 VIEW

[lateral]
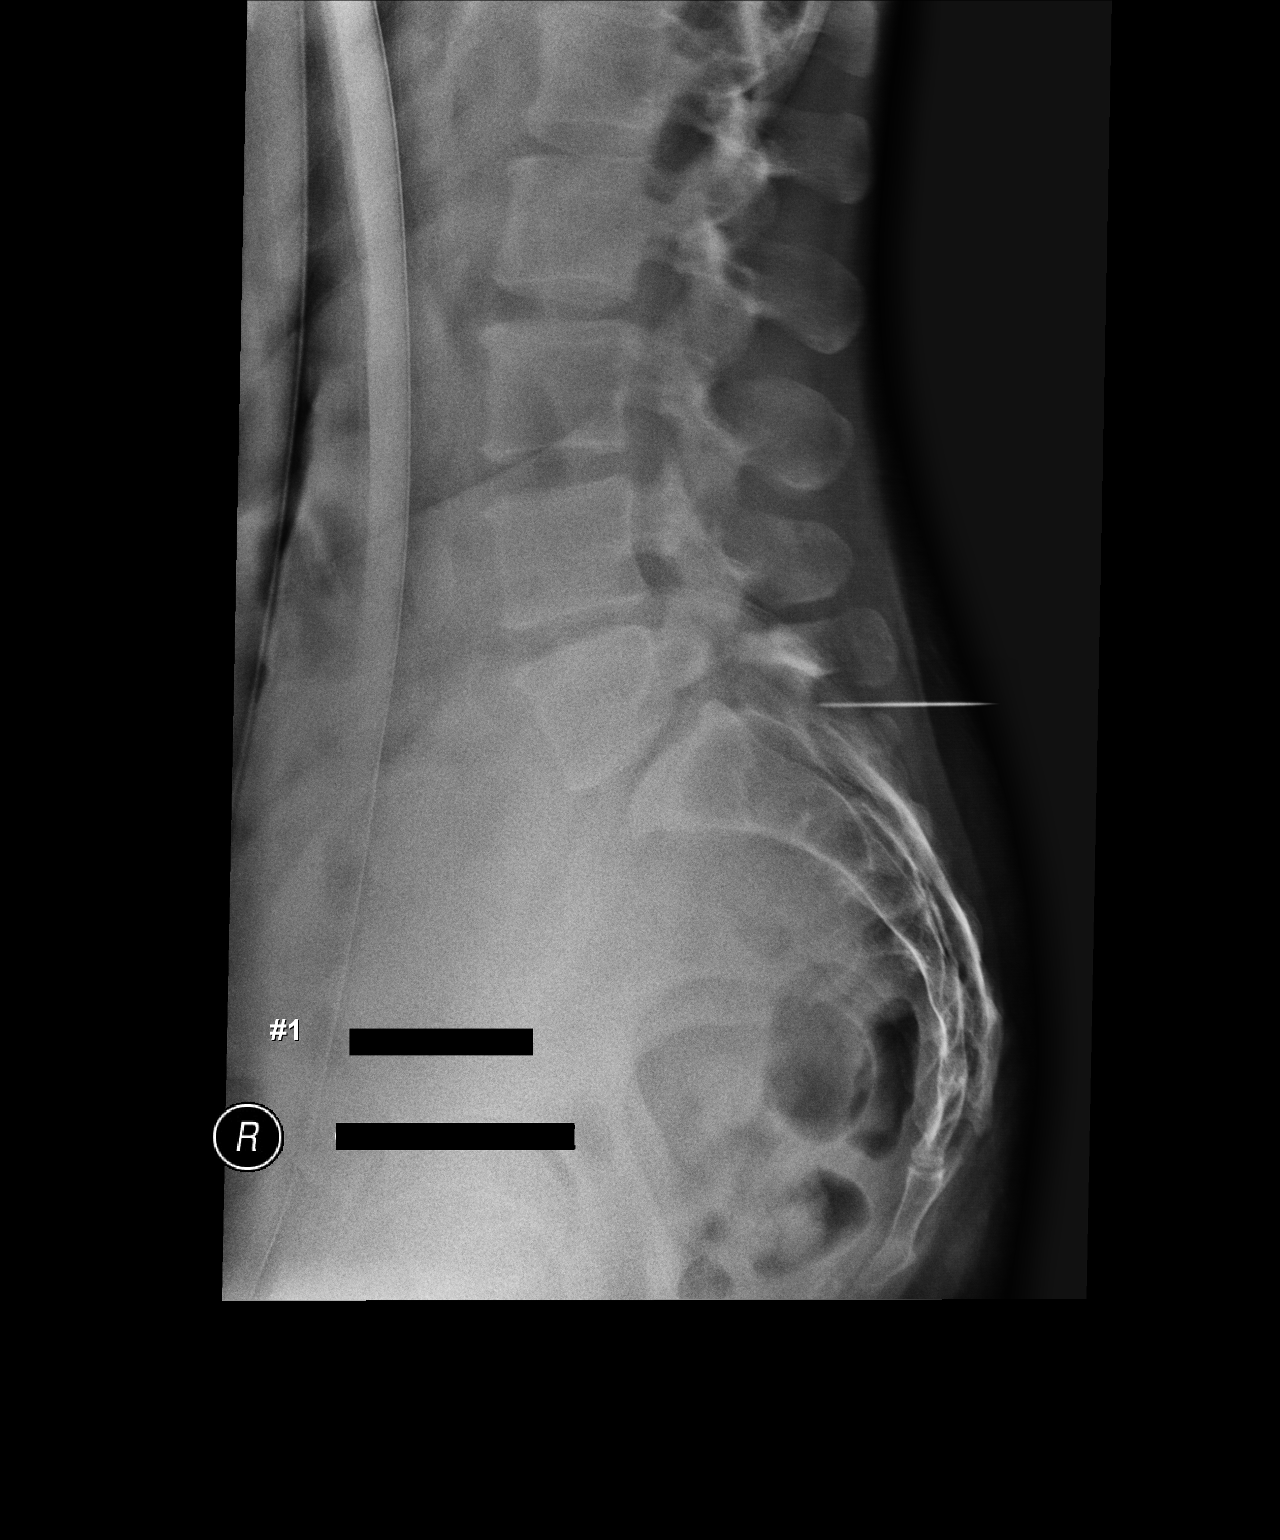

[1 of 1 positions shown; findings below may reference images not displayed]

FINDINGS: Single cross-table lateral lumbar spine x-ray. Posterior metallic
needle with the tip between the L5-S1 spinous processes.

Bilateral L5 pars interarticularis defects. Degenerative disc
disease with disc height loss at L4-5 and L5-S1.
IMPRESSION: Single cross-table lateral lumbar spine x-ray. Posterior metallic
needle with the tip between the L5-S1 spinous processes.

## 2017-01-05 IMAGING — RF DG C-ARM 61-120 MIN
1 series · 2 of 2 positions shown · non-contrast
Comparison: 03/05/2016.

CLINICAL DATA: L5-S1 PLIF.

EXAM:
LUMBAR SPINE - 2-3 VIEW; DG C-ARM 61-120 MIN

[Series 1: run · 2 of 2 slices shown]
[im 1/2]
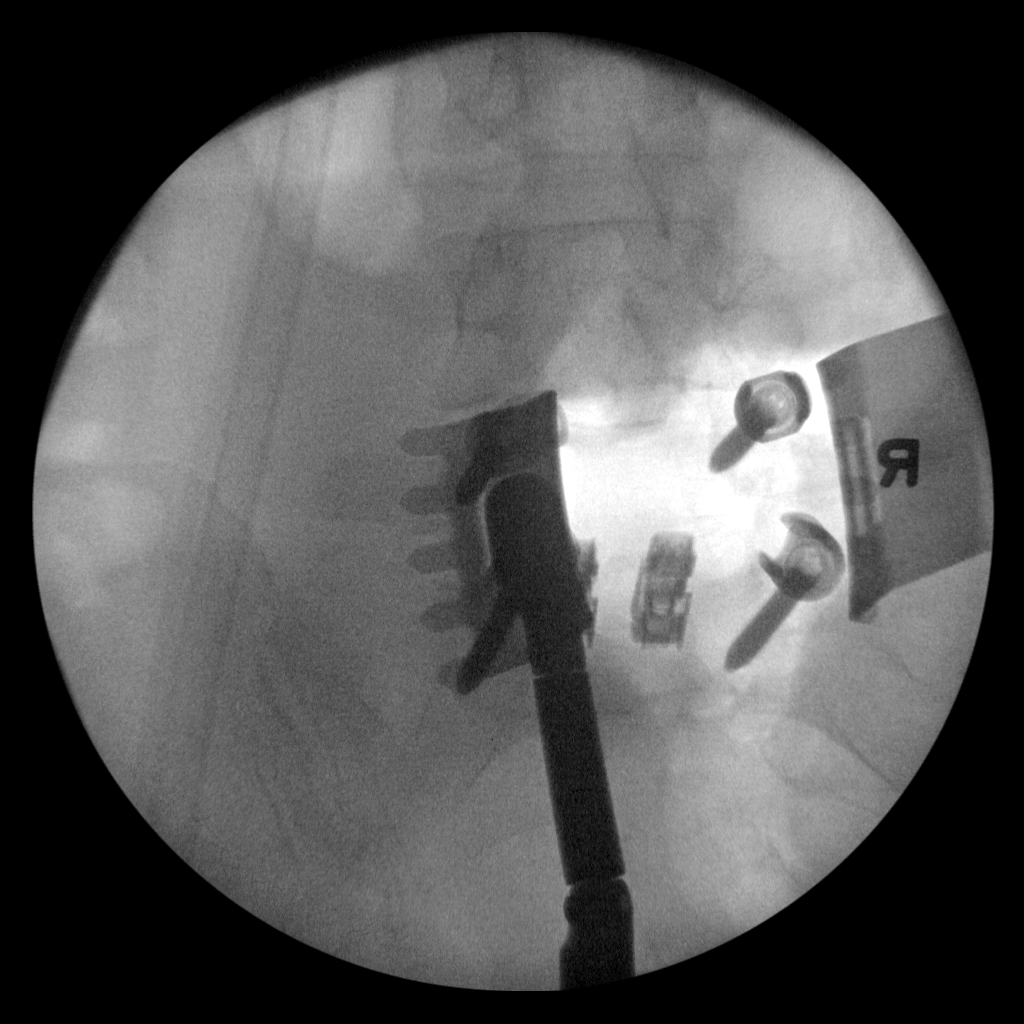
[im 2/2]
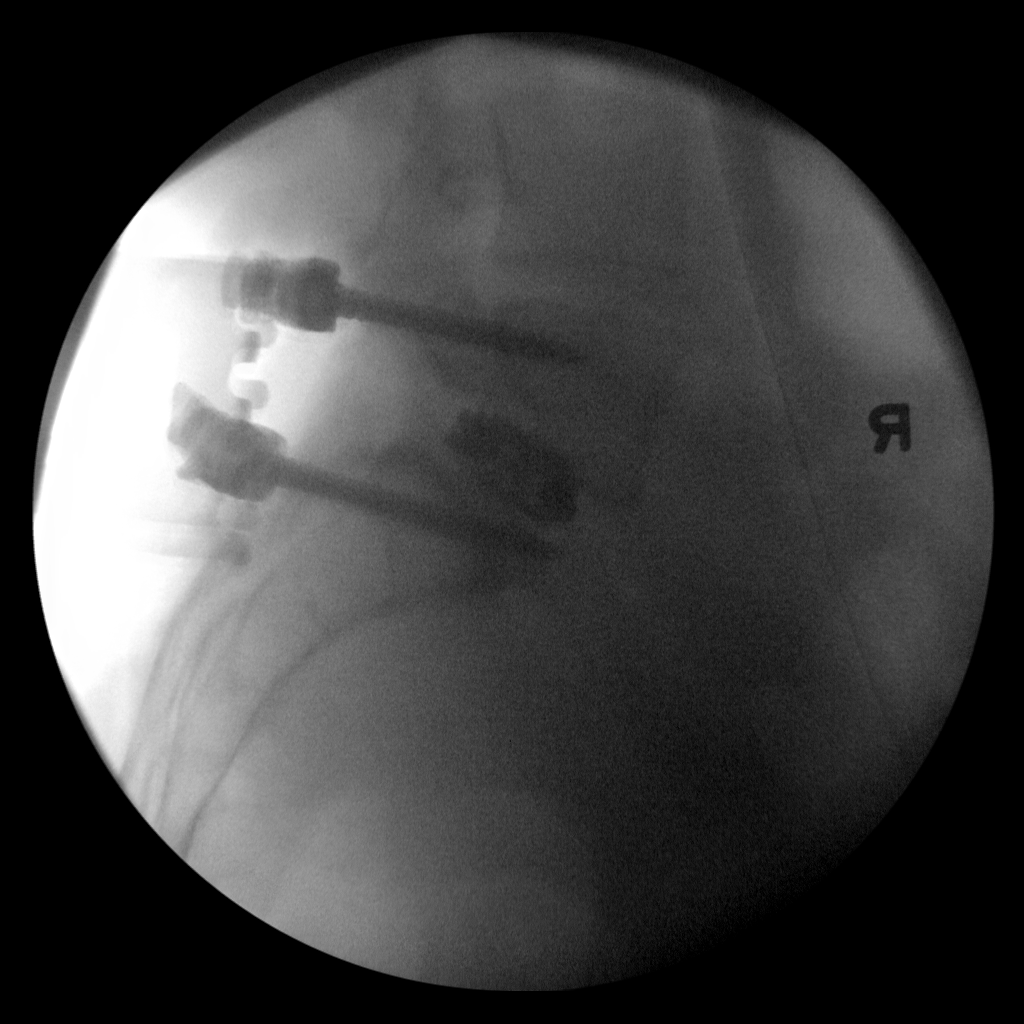

[2 of 2 positions shown; findings below may reference images not displayed]

FINDINGS: Two intraoperative fluoroscopic spot views of the lumbosacral
junction show bilateral pedicle screws and interbody spacers at
L5-S1. Retractor remains in place.
IMPRESSION: Intraoperative visualization for L5-S1 fusion.

## 2017-08-07 DIAGNOSIS — L732 Hidradenitis suppurativa: Secondary | ICD-10-CM | POA: Diagnosis not present

## 2017-08-07 DIAGNOSIS — L700 Acne vulgaris: Secondary | ICD-10-CM | POA: Diagnosis not present

## 2017-08-12 DIAGNOSIS — S39012A Strain of muscle, fascia and tendon of lower back, initial encounter: Secondary | ICD-10-CM | POA: Diagnosis not present

## 2017-09-17 ENCOUNTER — Other Ambulatory Visit: Payer: Self-pay | Admitting: Family Medicine

## 2017-09-17 ENCOUNTER — Ambulatory Visit
Admission: RE | Admit: 2017-09-17 | Discharge: 2017-09-17 | Disposition: A | Discharge status: Home / Self Care | Payer: BLUE CROSS/BLUE SHIELD | Source: Ambulatory Visit | Attending: Family Medicine | Admitting: Family Medicine

## 2017-09-17 DIAGNOSIS — R52 Pain, unspecified: Secondary | ICD-10-CM

## 2017-09-17 DIAGNOSIS — R1031 Right lower quadrant pain: Secondary | ICD-10-CM

## 2017-09-17 DIAGNOSIS — N83201 Unspecified ovarian cyst, right side: Secondary | ICD-10-CM | POA: Diagnosis not present

## 2017-09-17 DIAGNOSIS — M545 Low back pain: Secondary | ICD-10-CM | POA: Diagnosis not present

## 2017-09-17 DIAGNOSIS — R1030 Lower abdominal pain, unspecified: Secondary | ICD-10-CM | POA: Diagnosis not present

## 2017-09-17 DIAGNOSIS — G8929 Other chronic pain: Secondary | ICD-10-CM | POA: Diagnosis not present

## 2017-09-17 MED ORDER — IOPAMIDOL (ISOVUE-300) INJECTION 61%
125.0000 mL | Freq: Once | INTRAVENOUS | Status: AC | PRN
  Administered 2017-09-17: 125 mL via INTRAVENOUS

## 2017-10-14 DIAGNOSIS — Z6836 Body mass index (BMI) 36.0-36.9, adult: Secondary | ICD-10-CM | POA: Diagnosis not present

## 2017-10-14 DIAGNOSIS — M545 Low back pain: Secondary | ICD-10-CM | POA: Diagnosis not present

## 2017-10-14 DIAGNOSIS — I10 Essential (primary) hypertension: Secondary | ICD-10-CM | POA: Diagnosis not present

## 2017-10-24 DIAGNOSIS — I10 Essential (primary) hypertension: Secondary | ICD-10-CM | POA: Diagnosis not present

## 2017-10-24 DIAGNOSIS — M5126 Other intervertebral disc displacement, lumbar region: Secondary | ICD-10-CM | POA: Diagnosis not present

## 2017-10-24 DIAGNOSIS — Z0000 Encounter for general adult medical examination without abnormal findings: Secondary | ICD-10-CM | POA: Diagnosis not present

## 2017-10-24 DIAGNOSIS — F17210 Nicotine dependence, cigarettes, uncomplicated: Secondary | ICD-10-CM | POA: Diagnosis not present

## 2017-10-24 DIAGNOSIS — D171 Benign lipomatous neoplasm of skin and subcutaneous tissue of trunk: Secondary | ICD-10-CM | POA: Diagnosis not present

## 2017-10-29 ENCOUNTER — Other Ambulatory Visit: Payer: Self-pay | Admitting: Family Medicine

## 2017-10-29 DIAGNOSIS — N83201 Unspecified ovarian cyst, right side: Secondary | ICD-10-CM

## 2017-10-30 DIAGNOSIS — M545 Low back pain: Secondary | ICD-10-CM | POA: Diagnosis not present

## 2017-10-30 DIAGNOSIS — M5126 Other intervertebral disc displacement, lumbar region: Secondary | ICD-10-CM | POA: Diagnosis not present

## 2017-11-03 ENCOUNTER — Ambulatory Visit: Payer: Self-pay | Admitting: General Surgery

## 2017-11-03 DIAGNOSIS — D171 Benign lipomatous neoplasm of skin and subcutaneous tissue of trunk: Secondary | ICD-10-CM | POA: Diagnosis not present

## 2017-11-03 NOTE — H&P (Signed)
History of Present Illness Ralene Ok MD; 11/03/2017 2:36 PM) The patient is a 42 year old female who presents with a complaint of lipoma. Patient is a 42 year old female with a chief complaint of a back mass is referred by Dr. Nancy Fetter. Patient states that she noticed a left back mass approximately 1 year ago. He states his gotten bigger over that period of time. She states that it does bother however is not painful. She states this is regular bra line and bothers her on a daily basis. States there is been no erythema or drainage from that area.  Patient had a history of a previous lumbar spine fusion by Dr. Joya Salm.  She does have a recent diagnosis of COPD however is currently not on any inhalers. She states that she did well from her lumbar spine fusion surgery.   Past Surgical History ([name], [place]; 11/03/2017 2:24 PM) Spinal Surgery - Lower Back   Diagnostic Studies History ([name], [place]; 11/03/2017 2:24 PM) Colonoscopy  never Mammogram  never Pap Smear  1-5 years ago  Allergies ([name], [place]; 11/03/2017 2:25 PM) Flagyl *ANTI-INFECTIVE AGENTS - MISC.*  Rash.  Medication History ([name], RMA; 11/03/2017 2:25 PM) Cyclobenzaprine HCl (10MG  Tablet, Oral) Active. Lisinopril-Hydrochlorothiazide (10-12.5MG  Tablet, Oral) Active. Minocycline HCl (100MG  Capsule, Oral) Active. Medications Reconciled  Social History ([name], [place]; 11/03/2017 2:24 PM) Caffeine use  Carbonated beverages, Coffee. No alcohol use  No drug use  Tobacco use  Current every day smoker.  Family History ([name], [place]; 11/03/2017 2:24 PM) Alcohol Abuse  Mother. Cancer  Father. Diabetes Mellitus  Father. Heart Disease  Father. Heart disease in female family member before age 79  Heart disease in female family member before age 47  Hypertension  Father, Mother.  Pregnancy / Birth History ([name], [place]; 11/03/2017 2:24 PM) Age at  menarche  3 years. Contraceptive History  Oral contraceptives. Gravida  3 Maternal age  73-20 Para  2 Regular periods   Other Problems ([name], [place]; 11/03/2017 2:24 PM) Asthma  Back Pain  Chronic Obstructive Lung Disease  High blood pressure  Hypercholesterolemia  Migraine Headache     Review of Systems Ralene Ok MD; 11/03/2017 2:35 PM) General Not Present- Appetite Loss, Chills, Fatigue, Fever, Night Sweats, Weight Gain and Weight Loss. Skin Not Present- Change in Wart/Mole, Dryness, Hives, Jaundice, New Lesions, Non-Healing Wounds, Rash and Ulcer. HEENT Not Present- Earache, Hearing Loss, Hoarseness, Nose Bleed, Oral Ulcers, Ringing in the Ears, Seasonal Allergies, Sinus Pain, Sore Throat, Visual Disturbances, Wears glasses/contact lenses and Yellow Eyes. Respiratory Present- Snoring and Wheezing. Not Present- Bloody sputum, Chronic Cough and Difficulty Breathing. Breast Not Present- Breast Mass, Breast Pain, Nipple Discharge and Skin Changes. Cardiovascular Not Present- Chest Pain, Difficulty Breathing Lying Down, Leg Cramps, Palpitations, Rapid Heart Rate, Shortness of Breath and Swelling of Extremities. Gastrointestinal Not Present- Abdominal Pain, Bloating, Bloody Stool, Change in Bowel Habits, Chronic diarrhea, Constipation, Difficulty Swallowing, Excessive gas, Gets full quickly at meals, Hemorrhoids, Indigestion, Nausea, Rectal Pain and Vomiting. Female Genitourinary Not Present- Frequency, Nocturia, Painful Urination, Pelvic Pain and Urgency. Musculoskeletal Present- Back Pain and Muscle Pain. Not Present- Joint Pain, Joint Stiffness, Muscle Weakness and Swelling of Extremities. Neurological Present- Headaches. Not Present- Decreased Memory, Fainting, Numbness, Seizures, Tingling, Tremor, Trouble walking and Weakness. Psychiatric Not Present- Anxiety, Bipolar, Change in Sleep Pattern, Depression, Fearful and Frequent crying. Endocrine Not Present-  Cold Intolerance, Excessive Hunger, Hair Changes, Heat Intolerance, Hot  flashes and New Diabetes. Hematology Not Present- Blood Thinners, Easy Bruising, Excessive bleeding, Gland problems, HIV and Persistent Infections. All other systems negative  Vitals (Tanisha A. Brown RMA; 11/03/2017 2:25 PM) 11/03/2017 2:24 PM Weight: 211.8 lb Height: 64in Body Surface Area: 2 m Body Mass Index: 36.36 kg/m  Temp.: 98.49F  Pulse: 101 (Regular)  BP: 134/82 (Sitting, Left Arm, Standard)       Physical Exam Ralene Ok MD; 11/03/2017 2:36 PM) The physical exam findings are as follows: Note:Constitutional: No acute distress, conversant, appears stated age  Eyes: Anicteric sclerae, moist conjunctiva, no lid lag  Neck: No thyromegaly, trachea midline, no cervical lymphadenopathy  Lungs: Clear to auscultation biilaterally, normal respiratory effot  Cardiovascular: regular rate & rhythm, no murmurs, no peripheal edema, pedal pulses 2+  GI: Soft, no masses or hepatosplenomegaly, non-tender to palpation  MSK: Normal gait, no clubbing cyanosis, edema  Skin: No rashes, palpation reveals normal skin turgor, approximately 4 x 4 centimeter left paraspinal subcutaneous mass. Mobile, nonerythematous  Psychiatric: Appropriate judgment and insight, oriented to person, place, and time    Assessment & Plan Ralene Ok MD; 11/03/2017 2:38 PM) LIPOMA OF BACK (D17.1) Impression: 42 year old female with a left paraspinal subcutaneous lipoma. 1. The patient like to proceed to the OR For excision of back mass. 2. I discussed with her the risks and benefits of the procedure to include but not limited to: Infection, bleeding, damage to structures, possible recurrence. Patient voiced understanding and wished to proceed.

## 2017-11-03 NOTE — H&P (View-Only) (Signed)
History of Present Illness Kristin Ok MD; 11/03/2017 2:36 PM) The patient is a 42 year old female who presents with a complaint of lipoma. Patient is a 42 year old female with a chief complaint of a back mass is referred by Dr. Nancy Fetter. Patient states that she noticed a left back mass approximately 1 year ago. He states his gotten bigger over that period of time. She states that it does bother however is not painful. She states this is regular bra line and bothers her on a daily basis. States there is been no erythema or drainage from that area.  Patient had a history of a previous lumbar spine fusion by Dr. Joya Salm.  She does have a recent diagnosis of COPD however is currently not on any inhalers. She states that she did well from her lumbar spine fusion surgery.   Past Surgical History (Kristin Perry, [place]; 11/03/2017 2:24 PM) Spinal Surgery - Lower Back   Diagnostic Studies History (Kristin Perry, [place]; 11/03/2017 2:24 PM) Colonoscopy  never Mammogram  never Pap Smear  1-5 years ago  Allergies (Kristin Perry, [place]; 11/03/2017 2:25 PM) Flagyl *ANTI-INFECTIVE AGENTS - MISC.*  Rash.  Medication History (Kristin Perry, RMA; 11/03/2017 2:25 PM) Cyclobenzaprine HCl (10MG  Tablet, Oral) Active. Lisinopril-Hydrochlorothiazide (10-12.5MG  Tablet, Oral) Active. Minocycline HCl (100MG  Capsule, Oral) Active. Medications Reconciled  Social History (Kristin Perry, [place]; 11/03/2017 2:24 PM) Caffeine use  Carbonated beverages, Coffee. No alcohol use  No drug use  Tobacco use  Current every day smoker.  Family History (Kristin Perry, [place]; 11/03/2017 2:24 PM) Alcohol Abuse  Mother. Cancer  Father. Diabetes Mellitus  Father. Heart Disease  Father. Heart disease in female family member before age 71  Heart disease in female family member before age 78  Hypertension  Father, Mother.  Pregnancy / Birth History (Kristin Perry, [place]; 11/03/2017 2:24 PM) Age at  menarche  56 years. Contraceptive History  Oral contraceptives. Gravida  3 Maternal age  51-20 Para  2 Regular periods   Other Problems (Kristin Perry, [place]; 11/03/2017 2:24 PM) Asthma  Back Pain  Chronic Obstructive Lung Disease  High blood pressure  Hypercholesterolemia  Migraine Headache     Review of Systems Kristin Ok MD; 11/03/2017 2:35 PM) General Not Present- Appetite Loss, Chills, Fatigue, Fever, Night Sweats, Weight Gain and Weight Loss. Skin Not Present- Change in Wart/Mole, Dryness, Hives, Jaundice, New Lesions, Non-Healing Wounds, Rash and Ulcer. HEENT Not Present- Earache, Hearing Loss, Hoarseness, Nose Bleed, Oral Ulcers, Ringing in the Ears, Seasonal Allergies, Sinus Pain, Sore Throat, Visual Disturbances, Wears glasses/contact lenses and Yellow Eyes. Respiratory Present- Snoring and Wheezing. Not Present- Bloody sputum, Chronic Cough and Difficulty Breathing. Breast Not Present- Breast Mass, Breast Pain, Nipple Discharge and Skin Changes. Cardiovascular Not Present- Chest Pain, Difficulty Breathing Lying Down, Leg Cramps, Palpitations, Rapid Heart Rate, Shortness of Breath and Swelling of Extremities. Gastrointestinal Not Present- Abdominal Pain, Bloating, Bloody Stool, Change in Bowel Habits, Chronic diarrhea, Constipation, Difficulty Swallowing, Excessive gas, Gets full quickly at meals, Hemorrhoids, Indigestion, Nausea, Rectal Pain and Vomiting. Female Genitourinary Not Present- Frequency, Nocturia, Painful Urination, Pelvic Pain and Urgency. Musculoskeletal Present- Back Pain and Muscle Pain. Not Present- Joint Pain, Joint Stiffness, Muscle Weakness and Swelling of Extremities. Neurological Present- Headaches. Not Present- Decreased Memory, Fainting, Numbness, Seizures, Tingling, Tremor, Trouble walking and Weakness. Psychiatric Not Present- Anxiety, Bipolar, Change in Sleep Pattern, Depression, Fearful and Frequent crying. Endocrine Not Present-  Cold Intolerance, Excessive Hunger, Hair Changes, Heat Intolerance, Hot  flashes and New Diabetes. Hematology Not Present- Blood Thinners, Easy Bruising, Excessive bleeding, Gland problems, HIV and Persistent Infections. All other systems negative  Vitals (Kristin A. Brown RMA; 11/03/2017 2:25 PM) 11/03/2017 2:24 PM Weight: 211.8 lb Height: 64in Body Surface Area: 2 m Body Mass Index: 36.36 kg/m  Temp.: 98.59F  Pulse: 101 (Regular)  BP: 134/82 (Sitting, Left Arm, Standard)       Physical Exam Kristin Ok MD; 11/03/2017 2:36 PM) The physical exam findings are as follows: Note:Constitutional: No acute distress, conversant, appears stated age  Eyes: Anicteric sclerae, moist conjunctiva, no lid lag  Neck: No thyromegaly, trachea midline, no cervical lymphadenopathy  Lungs: Clear to auscultation biilaterally, normal respiratory effot  Cardiovascular: regular rate & rhythm, no murmurs, no peripheal edema, pedal pulses 2+  GI: Soft, no masses or hepatosplenomegaly, non-tender to palpation  MSK: Normal gait, no clubbing cyanosis, edema  Skin: No rashes, palpation reveals normal skin turgor, approximately 4 x 4 centimeter left paraspinal subcutaneous mass. Mobile, nonerythematous  Psychiatric: Appropriate judgment and insight, oriented to person, place, and time    Assessment & Plan Kristin Ok MD; 11/03/2017 2:38 PM) LIPOMA OF BACK (D17.1) Impression: 42 year old female with a left paraspinal subcutaneous lipoma. 1. The patient like to proceed to the OR For excision of back mass. 2. I discussed with her the risks and benefits of the procedure to include but not limited to: Infection, bleeding, damage to structures, possible recurrence. Patient voiced understanding and wished to proceed.

## 2017-11-11 ENCOUNTER — Ambulatory Visit
Admission: RE | Admit: 2017-11-11 | Discharge: 2017-11-11 | Disposition: A | Discharge status: Home / Self Care | Payer: BLUE CROSS/BLUE SHIELD | Source: Ambulatory Visit | Attending: Family Medicine | Admitting: Family Medicine

## 2017-11-11 DIAGNOSIS — N83201 Unspecified ovarian cyst, right side: Secondary | ICD-10-CM | POA: Diagnosis not present

## 2017-11-17 DIAGNOSIS — M5136 Other intervertebral disc degeneration, lumbar region: Secondary | ICD-10-CM | POA: Diagnosis not present

## 2017-11-18 NOTE — Pre-Procedure Instructions (Signed)
Kristin Perry  11/18/2017      [facility], [place] - [address] [id number] [id number] [place] [address] Phone: [phone] Fax: [phone]    Your procedure is scheduled on Nov 26, 2017.  Report to [facility] Admitting at 945 AM.  Call this number if you have problems the morning of surgery:  [phone]  Continue all medications as directed by your physician except follow these medication instructions before surgery below   Remember:  Do not eat food or drink liquids after midnight.  Take these medicines the morning of surgery with A SIP OF WATER  acetaminophen (tylenol) Cyclobenzaprine (flexeril) Minocycline (minocin)  7 days prior to surgery STOP taking any Aspirin (unless otherwise instructed by your surgeon), Aleve, Naproxen, Ibuprofen, Motrin, Advil, Goody's, BC's, all herbal medications, fish oil, and all vitamins    Contacts, dentures or bridgework may not be worn into surgery.  Leave your suitcase in the car.  After surgery it may be brought to your room.  For patients admitted to the hospital, discharge time will be determined by your treatment team.  Patients discharged the day of surgery will not be allowed to drive home.    [place]- Preparing For Surgery  Before surgery, you can play an important role. Because skin is not sterile, your skin needs to be as free of germs as possible. You can reduce the number of germs on your skin by washing with CHG (chlorahexidine gluconate) Soap before surgery.  CHG is an antiseptic cleaner which kills germs and bonds with the skin to continue killing germs even after washing.  Please do not use if you have an allergy to CHG or antibacterial soaps. If your skin becomes reddened/irritated stop using the CHG.  Do not shave (including legs and underarms) for at least 48 hours prior to first CHG shower. It is OK to shave your face.  Please follow these instructions  carefully.   1. Shower the NIGHT BEFORE SURGERY and the MORNING OF SURGERY with CHG.   2. If you chose to wash your hair, wash your hair first as usual with your normal shampoo.  3. After you shampoo, rinse your hair and body thoroughly to remove the shampoo.  4. Use CHG as you would any other liquid soap. You can apply CHG directly to the skin and wash gently with a scrungie or a clean washcloth.   5. Apply the CHG Soap to your body ONLY FROM THE NECK DOWN.  Do not use on open wounds or open sores. Avoid contact with your eyes, ears, mouth and genitals (private parts). Wash Face and genitals (private parts)  with your normal soap.  6. Wash thoroughly, paying special attention to the area where your surgery will be performed.  7. Thoroughly rinse your body with warm water from the neck down.  8. DO NOT shower/wash with your normal soap after using and rinsing off the CHG Soap.  9. Pat yourself dry with a CLEAN TOWEL.  10. Wear CLEAN PAJAMAS to bed the night before surgery, wear comfortable clothes the morning of surgery  11. Place CLEAN SHEETS on your bed the night of your first shower and DO NOT SLEEP WITH PETS.  Day of Surgery: Do not apply any deodorants/lotions. Please wear clean clothes to the hospital/surgery center.               Do not wear jewelry, make-up or nail polish.  Do not  wear lotions, powders, or perfumes, or deodorant.  Do not shave 48 hours prior to surgery.  Men may shave face and neck.  Do not bring valuables to the hospital.   [facility] is not responsible for any belongings or valuables.  Please read over the following fact sheets that you were given. Pain Booklet, Coughing and Deep Breathing and Surgical Site Infection Prevention

## 2017-11-19 ENCOUNTER — Encounter (HOSPITAL_COMMUNITY): Payer: Self-pay

## 2017-11-19 ENCOUNTER — Other Ambulatory Visit: Payer: Self-pay

## 2017-11-19 ENCOUNTER — Encounter (HOSPITAL_COMMUNITY)
Admission: RE | Admit: 2017-11-19 | Discharge: 2017-11-19 | Disposition: A | Discharge status: Home / Self Care | Payer: BLUE CROSS/BLUE SHIELD | Source: Ambulatory Visit | Attending: General Surgery | Admitting: General Surgery

## 2017-11-19 DIAGNOSIS — Z01812 Encounter for preprocedural laboratory examination: Secondary | ICD-10-CM | POA: Insufficient documentation

## 2017-11-19 DIAGNOSIS — Z01818 Encounter for other preprocedural examination: Secondary | ICD-10-CM | POA: Insufficient documentation

## 2017-11-19 HISTORY — DX: Unspecified osteoarthritis, unspecified site: M19.90

## 2017-11-19 LAB — CBC
HEMATOCRIT: 42.6 % (ref 36.0–46.0)
HEMOGLOBIN: 14.6 g/dL (ref 12.0–15.0)
MCH: 32.9 pg (ref 26.0–34.0)
MCHC: 34.3 g/dL (ref 30.0–36.0)
MCV: 95.9 fL (ref 78.0–100.0)
Platelets: 221 10*3/uL (ref 150–400)
RBC: 4.44 MIL/uL (ref 3.87–5.11)
RDW: 13.4 % (ref 11.5–15.5)
WBC: 7.9 10*3/uL (ref 4.0–10.5)

## 2017-11-19 LAB — BASIC METABOLIC PANEL
ANION GAP: 11 (ref 5–15)
BUN: 5 mg/dL — ABNORMAL LOW (ref 6–20)
CO2: 24 mmol/L (ref 22–32)
Calcium: 9.3 mg/dL (ref 8.9–10.3)
Chloride: 106 mmol/L (ref 101–111)
Creatinine, Ser: 0.7 mg/dL (ref 0.44–1.00)
GFR calc Af Amer: >60 mL/min (ref >60)
GFR calc non Af Amer: >60 mL/min (ref >60)
GLUCOSE: 102 mg/dL — AB (ref 65–99)
POTASSIUM: 4.2 mmol/L (ref 3.5–5.1)
Sodium: 141 mmol/L (ref 135–145)

## 2017-11-19 NOTE — Progress Notes (Signed)
PCP: Donald Prose, MD  Cardiologist: pt denies  EKG: pt denies past year, obtained today  Stress test: pt denies  ECHO: pt denies  Cardiac Cath: pt denies  Chest x-ray: pt denies past year, no recent respiratory complications/infections

## 2017-11-26 ENCOUNTER — Ambulatory Visit (HOSPITAL_COMMUNITY): Payer: BLUE CROSS/BLUE SHIELD | Admitting: Anesthesiology

## 2017-11-26 ENCOUNTER — Encounter (HOSPITAL_COMMUNITY): Payer: Self-pay

## 2017-11-26 ENCOUNTER — Other Ambulatory Visit: Payer: Self-pay

## 2017-11-26 ENCOUNTER — Encounter (HOSPITAL_COMMUNITY)
Admission: RE | Disposition: A | Discharge status: Home / Self Care | Payer: Self-pay | Source: Ambulatory Visit | Attending: General Surgery

## 2017-11-26 ENCOUNTER — Ambulatory Visit (HOSPITAL_COMMUNITY)
Admission: RE | Admit: 2017-11-26 | Discharge: 2017-11-26 | Disposition: A | Discharge status: Home / Self Care | Payer: BLUE CROSS/BLUE SHIELD | Source: Ambulatory Visit | Attending: General Surgery | Admitting: General Surgery

## 2017-11-26 DIAGNOSIS — E785 Hyperlipidemia, unspecified: Secondary | ICD-10-CM | POA: Diagnosis not present

## 2017-11-26 DIAGNOSIS — Z981 Arthrodesis status: Secondary | ICD-10-CM | POA: Insufficient documentation

## 2017-11-26 DIAGNOSIS — I10 Essential (primary) hypertension: Secondary | ICD-10-CM | POA: Diagnosis not present

## 2017-11-26 DIAGNOSIS — J449 Chronic obstructive pulmonary disease, unspecified: Secondary | ICD-10-CM | POA: Insufficient documentation

## 2017-11-26 DIAGNOSIS — Z883 Allergy status to other anti-infective agents status: Secondary | ICD-10-CM | POA: Diagnosis not present

## 2017-11-26 DIAGNOSIS — F17200 Nicotine dependence, unspecified, uncomplicated: Secondary | ICD-10-CM | POA: Insufficient documentation

## 2017-11-26 DIAGNOSIS — Z8249 Family history of ischemic heart disease and other diseases of the circulatory system: Secondary | ICD-10-CM | POA: Diagnosis not present

## 2017-11-26 DIAGNOSIS — E7800 Pure hypercholesterolemia, unspecified: Secondary | ICD-10-CM | POA: Diagnosis not present

## 2017-11-26 DIAGNOSIS — D171 Benign lipomatous neoplasm of skin and subcutaneous tissue of trunk: Secondary | ICD-10-CM | POA: Diagnosis not present

## 2017-11-26 DIAGNOSIS — Z79899 Other long term (current) drug therapy: Secondary | ICD-10-CM | POA: Insufficient documentation

## 2017-11-26 DIAGNOSIS — G43909 Migraine, unspecified, not intractable, without status migrainosus: Secondary | ICD-10-CM | POA: Diagnosis not present

## 2017-11-26 HISTORY — PX: LIPOMA EXCISION: SHX5283

## 2017-11-26 HISTORY — DX: Localized swelling, mass and lump, trunk: R22.2

## 2017-11-26 LAB — POCT PREGNANCY, URINE: Preg Test, Ur: NEGATIVE

## 2017-11-26 SURGERY — EXCISION LIPOMA
Anesthesia: General

## 2017-11-26 MED ORDER — ROCURONIUM BROMIDE 50 MG/5ML IV SOLN
INTRAVENOUS | Status: AC
  Filled 2017-11-26: qty 1

## 2017-11-26 MED ORDER — TRAMADOL HCL 50 MG PO TABS
50.0000 mg | ORAL_TABLET | Freq: Once | ORAL | Status: AC
  Administered 2017-11-26: 50 mg via ORAL

## 2017-11-26 MED ORDER — LACTATED RINGERS IV SOLN
INTRAVENOUS | Status: DC
  Administered 2017-11-26: 10:00:00 via INTRAVENOUS

## 2017-11-26 MED ORDER — CHLORHEXIDINE GLUCONATE CLOTH 2 % EX PADS: 6.0000 | MEDICATED_PAD | Freq: Once | CUTANEOUS | Status: DC

## 2017-11-26 MED ORDER — TRAMADOL HCL 50 MG PO TABS: 50.0000 mg | ORAL_TABLET | Freq: Four times a day (QID) | ORAL | 0 refills | Status: DC | PRN

## 2017-11-26 MED ORDER — MIDAZOLAM HCL 2 MG/2ML IJ SOLN
INTRAMUSCULAR | Status: AC
  Filled 2017-11-26: qty 2

## 2017-11-26 MED ORDER — ONDANSETRON HCL 4 MG/2ML IJ SOLN
INTRAMUSCULAR | Status: DC | PRN
  Administered 2017-11-26: 4 mg via INTRAVENOUS

## 2017-11-26 MED ORDER — ARTIFICIAL TEARS OPHTHALMIC OINT
TOPICAL_OINTMENT | OPHTHALMIC | Status: AC
  Filled 2017-11-26: qty 3.5

## 2017-11-26 MED ORDER — ROCURONIUM BROMIDE 10 MG/ML (PF) SYRINGE
PREFILLED_SYRINGE | INTRAVENOUS | Status: DC | PRN
  Administered 2017-11-26: 40 mg via INTRAVENOUS

## 2017-11-26 MED ORDER — FENTANYL CITRATE (PF) 100 MCG/2ML IJ SOLN
25.0000 ug | INTRAMUSCULAR | Status: DC | PRN
  Administered 2017-11-26: 25 ug via INTRAVENOUS
  Administered 2017-11-26: 50 ug via INTRAVENOUS

## 2017-11-26 MED ORDER — LIDOCAINE 2% (20 MG/ML) 5 ML SYRINGE
INTRAMUSCULAR | Status: AC
  Filled 2017-11-26: qty 5

## 2017-11-26 MED ORDER — SUGAMMADEX SODIUM 200 MG/2ML IV SOLN
INTRAVENOUS | Status: AC
  Filled 2017-11-26: qty 2

## 2017-11-26 MED ORDER — PROPOFOL 10 MG/ML IV BOLUS
INTRAVENOUS | Status: DC | PRN
  Administered 2017-11-26: 20 mg via INTRAVENOUS
  Administered 2017-11-26: 150 mg via INTRAVENOUS
  Administered 2017-11-26: 30 mg via INTRAVENOUS

## 2017-11-26 MED ORDER — FENTANYL CITRATE (PF) 250 MCG/5ML IJ SOLN
INTRAMUSCULAR | Status: DC | PRN
  Administered 2017-11-26: 100 ug via INTRAVENOUS

## 2017-11-26 MED ORDER — LIDOCAINE 2% (20 MG/ML) 5 ML SYRINGE
INTRAMUSCULAR | Status: DC | PRN
  Administered 2017-11-26: 80 mg via INTRAVENOUS

## 2017-11-26 MED ORDER — FENTANYL CITRATE (PF) 250 MCG/5ML IJ SOLN
INTRAMUSCULAR | Status: AC
  Filled 2017-11-26: qty 5

## 2017-11-26 MED ORDER — FENTANYL CITRATE (PF) 100 MCG/2ML IJ SOLN
INTRAMUSCULAR | Status: AC
  Administered 2017-11-26: 50 ug via INTRAVENOUS
  Filled 2017-11-26: qty 2

## 2017-11-26 MED ORDER — PROPOFOL 10 MG/ML IV BOLUS
INTRAVENOUS | Status: AC
  Filled 2017-11-26: qty 20

## 2017-11-26 MED ORDER — BUPIVACAINE-EPINEPHRINE 0.25% -1:200000 IJ SOLN
INTRAMUSCULAR | Status: DC | PRN
  Administered 2017-11-26: 10 mL

## 2017-11-26 MED ORDER — ONDANSETRON HCL 4 MG/2ML IJ SOLN
INTRAMUSCULAR | Status: AC
  Filled 2017-11-26: qty 2

## 2017-11-26 MED ORDER — MIDAZOLAM HCL 2 MG/2ML IJ SOLN
INTRAMUSCULAR | Status: DC | PRN
  Administered 2017-11-26: 2 mg via INTRAVENOUS

## 2017-11-26 MED ORDER — PROMETHAZINE HCL 25 MG/ML IJ SOLN: 6.2500 mg | INTRAMUSCULAR | Status: DC | PRN

## 2017-11-26 MED ORDER — SUGAMMADEX SODIUM 200 MG/2ML IV SOLN
INTRAVENOUS | Status: DC | PRN
  Administered 2017-11-26: 200 mg via INTRAVENOUS

## 2017-11-26 MED ORDER — TRAMADOL HCL 50 MG PO TABS
ORAL_TABLET | ORAL | Status: AC
  Filled 2017-11-26: qty 1

## 2017-11-26 MED ORDER — CEFAZOLIN SODIUM-DEXTROSE 2-4 GM/100ML-% IV SOLN
2.0000 g | INTRAVENOUS | Status: AC
  Administered 2017-11-26: 2 g via INTRAVENOUS
  Filled 2017-11-26: qty 100

## 2017-11-26 SURGICAL SUPPLY — 39 items
ADH SKN CLS LQ APL DERMABOND (GAUZE/BANDAGES/DRESSINGS) ×1
BLADE SURG 10 STRL SS (BLADE) ×3 IMPLANT
BLADE SURG 15 STRL LF DISP TIS (BLADE) ×1 IMPLANT
BLADE SURG 15 STRL SS (BLADE) ×3
CHLORAPREP W/TINT 26ML (MISCELLANEOUS) ×3 IMPLANT
COVER SURGICAL LIGHT HANDLE (MISCELLANEOUS) ×3 IMPLANT
DERMABOND ADHESIVE PROPEN (GAUZE/BANDAGES/DRESSINGS) ×2
DERMABOND ADVANCED .7 DNX6 (GAUZE/BANDAGES/DRESSINGS) IMPLANT
DRAPE LAPAROTOMY 100X72 PEDS (DRAPES) IMPLANT
DRAPE ORTHO SPLIT 77X108 STRL (DRAPES)
DRAPE SURG ORHT 6 SPLT 77X108 (DRAPES) IMPLANT
ELECT CAUTERY BLADE 6.4 (BLADE) ×3 IMPLANT
ELECT REM PT RETURN 9FT ADLT (ELECTROSURGICAL) ×3
ELECTRODE REM PT RTRN 9FT ADLT (ELECTROSURGICAL) ×1 IMPLANT
GAUZE SPONGE 4X4 12PLY STRL (GAUZE/BANDAGES/DRESSINGS) IMPLANT
GLOVE BIO SURGEON STRL SZ7.5 (GLOVE) ×3 IMPLANT
GLOVE BIOGEL PI IND STRL 8 (GLOVE) ×1 IMPLANT
GLOVE BIOGEL PI INDICATOR 8 (GLOVE) ×2
GOWN STRL REUS W/ TWL LRG LVL3 (GOWN DISPOSABLE) ×1 IMPLANT
GOWN STRL REUS W/ TWL XL LVL3 (GOWN DISPOSABLE) ×1 IMPLANT
GOWN STRL REUS W/TWL LRG LVL3 (GOWN DISPOSABLE) ×3
GOWN STRL REUS W/TWL XL LVL3 (GOWN DISPOSABLE) ×3
KIT BASIN OR (CUSTOM PROCEDURE TRAY) ×3 IMPLANT
KIT TURNOVER KIT B (KITS) ×3 IMPLANT
MARKER SKIN DUAL TIP RULER LAB (MISCELLANEOUS) ×2 IMPLANT
NDL HYPO 25GX1X1/2 BEV (NEEDLE) IMPLANT
NEEDLE HYPO 25GX1X1/2 BEV (NEEDLE) ×3 IMPLANT
NS IRRIG 1000ML POUR BTL (IV SOLUTION) ×3 IMPLANT
PACK SURGICAL SETUP 50X90 (CUSTOM PROCEDURE TRAY) ×3 IMPLANT
PAD ARMBOARD 7.5X6 YLW CONV (MISCELLANEOUS) ×5 IMPLANT
PENCIL BUTTON HOLSTER BLD 10FT (ELECTRODE) ×3 IMPLANT
SPECIMEN JAR SMALL (MISCELLANEOUS) ×3 IMPLANT
SPONGE LAP 18X18 X RAY DECT (DISPOSABLE) ×3 IMPLANT
SUT MNCRL AB 4-0 PS2 18 (SUTURE) ×3 IMPLANT
SUT VIC AB 2-0 SH 18 (SUTURE) ×2 IMPLANT
SUT VIC AB 3-0 SH 18 (SUTURE) ×3 IMPLANT
SYR CONTROL 10ML LL (SYRINGE) ×2 IMPLANT
TOWEL OR 17X24 6PK STRL BLUE (TOWEL DISPOSABLE) ×3 IMPLANT
UNDERPAD 30X30 (UNDERPADS AND DIAPERS) IMPLANT

## 2017-11-26 NOTE — Transfer of Care (Signed)
Immediate Anesthesia Transfer of Care Note  Patient: Kristin Perry  Procedure(s) Performed: EXCISION OF BACK LIPOMA (N/A )  Patient Location: PACU  Anesthesia Type:General  Level of Consciousness: drowsy  Airway & Oxygen Therapy: Patient Spontanous Breathing and Patient connected to nasal cannula oxygen  Post-op Assessment: Report given to RN and Post -op Vital signs reviewed and stable  Post vital signs: Reviewed and stable  Last Vitals:  Vitals Value Taken Time  BP    Temp    Pulse    Resp    SpO2      Last Pain:  Vitals:   11/26/17 1004  TempSrc: Oral  PainSc: 0-No pain         Complications: No apparent anesthesia complications

## 2017-11-26 NOTE — Interval H&P Note (Signed)
History and Physical Interval Note:  11/26/2017 10:25 AM  Kristin Perry  has presented today for surgery, with the diagnosis of BACK LIPOMA  The various methods of treatment have been discussed with the patient and family. After consideration of risks, benefits and other options for treatment, the patient has consented to  Procedure(s): EXCISION OF BACK LIPOMA (N/A) as a surgical intervention .  The patient's history has been reviewed, patient examined, no change in status, stable for surgery.  I have reviewed the patient's chart and labs.  Questions were answered to the patient's satisfaction.     Rosario Jacks., Anne Hahn

## 2017-11-26 NOTE — Anesthesia Procedure Notes (Signed)
Procedure Name: Intubation Date/Time: 11/26/2017 10:50 AM Performed by: Bryson Corona, CRNA Pre-anesthesia Checklist: Patient identified, Emergency Drugs available, Suction available and Patient being monitored Patient Re-evaluated:Patient Re-evaluated prior to induction Oxygen Delivery Method: Circle System Utilized Preoxygenation: Pre-oxygenation with 100% oxygen Induction Type: IV induction Ventilation: Mask ventilation without difficulty Laryngoscope Size: Mac and 3 Grade View: Grade I Tube type: Oral Tube size: 7.0 mm Number of attempts: 1 Airway Equipment and Method: Stylet and Bite block Placement Confirmation: ETT inserted through vocal cords under direct vision,  positive ETCO2 and breath sounds checked- equal and bilateral Secured at: 22 cm Tube secured with: Tape Dental Injury: Teeth and Oropharynx as per pre-operative assessment

## 2017-11-26 NOTE — Anesthesia Preprocedure Evaluation (Signed)
Anesthesia Evaluation  Patient identified by MRN, date of birth, ID band Patient awake    Reviewed: Allergy & Precautions, NPO status , Patient's Chart, lab work & pertinent test results  Airway Mallampati: II  TM Distance: >3 FB Neck ROM: Full    Dental no notable dental hx. (+) Caps   Pulmonary Current Smoker,    Pulmonary exam normal breath sounds clear to auscultation       Cardiovascular hypertension, Pt. on medications Normal cardiovascular exam Rhythm:Regular Rate:Normal     Neuro/Psych Anxiety Depression negative neurological ROS     GI/Hepatic negative GI ROS, Neg liver ROS,   Endo/Other  negative endocrine ROS  Renal/GU negative Renal ROS  negative genitourinary   Musculoskeletal negative musculoskeletal ROS (+)   Abdominal   Peds negative pediatric ROS (+)  Hematology negative hematology ROS (+)   Anesthesia Other Findings   Reproductive/Obstetrics negative OB ROS                             Anesthesia Physical Anesthesia Plan  ASA: II  Anesthesia Plan: General   Post-op Pain Management:    Induction: Intravenous  PONV Risk Score and Plan: 2 and Ondansetron and Treatment may vary due to age or medical condition  Airway Management Planned: LMA and Oral ETT  Additional Equipment:   Intra-op Plan:   Post-operative Plan: Extubation in OR  Informed Consent: I have reviewed the patients History and Physical, chart, labs and discussed the procedure including the risks, benefits and alternatives for the proposed anesthesia with the patient or authorized representative who has indicated his/her understanding and acceptance.   Dental advisory given  Plan Discussed with: CRNA  Anesthesia Plan Comments: (ETT if prone.)        Anesthesia Quick Evaluation

## 2017-11-26 NOTE — Op Note (Signed)
11/26/2017  11:17 AM  PATIENT:  Kalman Jewels  42 y.o. female  PRE-OPERATIVE DIAGNOSIS:  BACK LIPOMA  POST-OPERATIVE DIAGNOSIS:  BACK LIPOMA  PROCEDURE:  Procedure(s): EXCISION OF BACK LIPOMA (N/A)  SURGEON:  Surgeon(s) and Role:    * Ralene Ok, MD - Primary    * Gosai, Puja, PA-C - Assisting  ANESTHESIA:   local and general  EBL:  minimal   BLOOD ADMINISTERED:none  DRAINS: none   LOCAL MEDICATIONS USED:  BUPIVICAINE   SPECIMEN:  Source of Specimen:  Back mass  DISPOSITION OF SPECIMEN:  PATHOLOGY  COUNTS:  YES  TOURNIQUET:  * No tourniquets in log *  DICTATION: .Dragon Dictation After the patient was consented she was taken to OR and placed in supine position with bilateral SCDs in place. She underwent GETA. She was in place in the prone position. Patient was prepped and draped in standard fashion. Timeout was called all facts verified.  Quarter percent Marcaine with epinephrine was then used to create a field block around the mass.  A 5cm  incision was made over the area of the mass. This was dissected down sharply and circumferentially and the mass was dissected away from surrounding tissue with cautery.    This was excised and measured 6x4x4cm. This was sent to pathology. Cautery was used to obtain hemostasis. At this time 3-0 Vicryl used to reapproximate the deep dermal layers. 3-0 Monocryl was used to reapproximate the skin in a subcuticular fashion. The skin was dressed with Dermabond. The patient toelrated the procedure well was taken to recovery room stable condition.  My assistant was vital in helping with retraction and exposure during the entire operation.    PLAN OF CARE: Discharge to home after PACU  PATIENT DISPOSITION:  PACU - hemodynamically stable.   Delay start of Pharmacological VTE agent (>24hrs) due to surgical blood loss or risk of bleeding: not applicable

## 2017-11-26 NOTE — Discharge Instructions (Signed)
Lipoma Removal, Care After  Refer to this sheet in the next few weeks. These instructions provide you with information about caring for yourself after your procedure. Your health care provider may also give you more specific instructions. Your treatment has been planned according to current medical practices, but problems sometimes occur. Call your health care provider if you have any problems or questions after your procedure.  What can I expect after the procedure?  After the procedure, it is common to have:  · Mild pain.  · Swelling.  · Bruising.    Follow these instructions at home:    Bathing  · Do not take baths, swim, or use a hot tub until your health care provider approves. Ask your health care provider if you can take showers. You may only be allowed to take sponge baths for bathing.  · Keep your bandage (dressing) dry until your health care provider says it can be removed.  Incision care    · Follow instructions from your health care provider about how to take care of your incision. Make sure you:  ? Wash your hands with soap and water before you change your bandage (dressing). If soap and water are not available, use hand sanitizer.  ? Change your dressing as told by your health care provider.  ? Leave stitches (sutures), skin glue, or adhesive strips in place. These skin closures may need to stay in place for 2 weeks or longer. If adhesive strip edges start to loosen and curl up, you may trim the loose edges. Do not remove adhesive strips completely unless your health care provider tells you to do that.  · Check your incision area every day for signs of infection. Check for:  ? More redness, swelling, or pain.  ? Fluid or blood.  ? Warmth.  ? Pus or a bad smell.  Driving  · Do not drive or operate heavy machinery while taking prescription pain medicine.  · Do not drive for 24 hours if you received a medicine to help you relax (sedative) during your procedure.  · Ask your health care provider when it is  safe for you to drive.  General instructions  · Take over-the-counter and prescription medicines only as told by your health care provider.  · Do not use any tobacco products, such as cigarettes, chewing tobacco, and e-cigarettes. These can delay healing. If you need help quitting, ask your health care provider.  · Return to your normal activities as told by your health care provider. Ask your health care provider what activities are safe for you.  · Keep all follow-up visits as told by your health care provider. This is important.  Contact a health care provider if:  · You have more redness, swelling, or pain around your incision.  · You have fluid or blood coming from your incision.  · Your incision feels warm to the touch.  · You have pus or a bad smell coming from your incision.  · You have pain that does not get better with medicine.  Get help right away if:  · You have chills or a fever.  · You have severe pain.  This information is not intended to replace advice given to you by your health care provider. Make sure you discuss any questions you have with your health care provider.  Document Released: 09/21/2015 Document Revised: 12/19/2015 Document Reviewed: 09/21/2015  Elsevier Interactive Patient Education © 2018 [employer]

## 2017-11-27 ENCOUNTER — Encounter (HOSPITAL_COMMUNITY): Payer: Self-pay | Admitting: General Surgery

## 2017-11-27 NOTE — Anesthesia Postprocedure Evaluation (Signed)
Anesthesia Post Note  Patient: Kristin Perry  Procedure(s) Performed: EXCISION OF BACK LIPOMA (N/A )     Patient location during evaluation: PACU Anesthesia Type: General Level of consciousness: awake and alert Pain management: pain level controlled Vital Signs Assessment: post-procedure vital signs reviewed and stable Respiratory status: spontaneous breathing, nonlabored ventilation and respiratory function stable Cardiovascular status: blood pressure returned to baseline and stable Postop Assessment: no apparent nausea or vomiting Anesthetic complications: no    Last Vitals:  Vitals:   11/26/17 1225 11/26/17 1240  BP: 100/78 (!) 105/57  Pulse: 86 83  Resp: 18 12  Temp:  36.4 C  SpO2: 98% 98%    Last Pain:  Vitals:   11/26/17 1240  TempSrc:   PainSc: 4    Pain Goal: Patients Stated Pain Goal: 3 (11/26/17 1240)               Catalina Gravel

## 2018-01-05 DIAGNOSIS — L218 Other seborrheic dermatitis: Secondary | ICD-10-CM | POA: Diagnosis not present

## 2018-02-12 DIAGNOSIS — K2900 Acute gastritis without bleeding: Secondary | ICD-10-CM | POA: Diagnosis not present

## 2018-04-24 DIAGNOSIS — I10 Essential (primary) hypertension: Secondary | ICD-10-CM | POA: Diagnosis not present

## 2018-04-24 DIAGNOSIS — E785 Hyperlipidemia, unspecified: Secondary | ICD-10-CM | POA: Diagnosis not present

## 2018-04-24 DIAGNOSIS — R6882 Decreased libido: Secondary | ICD-10-CM | POA: Diagnosis not present

## 2018-04-24 DIAGNOSIS — I700 Atherosclerosis of aorta: Secondary | ICD-10-CM | POA: Diagnosis not present

## 2018-05-08 DIAGNOSIS — R112 Nausea with vomiting, unspecified: Secondary | ICD-10-CM | POA: Diagnosis not present

## 2018-05-08 DIAGNOSIS — F17200 Nicotine dependence, unspecified, uncomplicated: Secondary | ICD-10-CM | POA: Diagnosis not present

## 2018-05-08 DIAGNOSIS — R6883 Chills (without fever): Secondary | ICD-10-CM | POA: Diagnosis not present

## 2018-05-08 DIAGNOSIS — G43909 Migraine, unspecified, not intractable, without status migrainosus: Secondary | ICD-10-CM | POA: Diagnosis not present

## 2018-05-08 DIAGNOSIS — I10 Essential (primary) hypertension: Secondary | ICD-10-CM | POA: Diagnosis not present

## 2018-05-08 DIAGNOSIS — Z7982 Long term (current) use of aspirin: Secondary | ICD-10-CM | POA: Diagnosis not present

## 2018-05-08 DIAGNOSIS — E7800 Pure hypercholesterolemia, unspecified: Secondary | ICD-10-CM | POA: Diagnosis not present

## 2018-05-08 DIAGNOSIS — R51 Headache: Secondary | ICD-10-CM | POA: Diagnosis not present

## 2018-05-09 DIAGNOSIS — G43909 Migraine, unspecified, not intractable, without status migrainosus: Secondary | ICD-10-CM | POA: Diagnosis not present

## 2018-05-10 DIAGNOSIS — D496 Neoplasm of unspecified behavior of brain: Secondary | ICD-10-CM | POA: Diagnosis not present

## 2018-05-10 DIAGNOSIS — G932 Benign intracranial hypertension: Secondary | ICD-10-CM | POA: Diagnosis not present

## 2018-05-10 DIAGNOSIS — R51 Headache: Secondary | ICD-10-CM | POA: Diagnosis not present

## 2018-05-10 DIAGNOSIS — I10 Essential (primary) hypertension: Secondary | ICD-10-CM | POA: Diagnosis not present

## 2018-05-10 DIAGNOSIS — H4710 Unspecified papilledema: Secondary | ICD-10-CM | POA: Diagnosis not present

## 2018-05-10 DIAGNOSIS — G43001 Migraine without aura, not intractable, with status migrainosus: Secondary | ICD-10-CM | POA: Diagnosis not present

## 2018-05-10 DIAGNOSIS — G43901 Migraine, unspecified, not intractable, with status migrainosus: Secondary | ICD-10-CM | POA: Diagnosis not present

## 2018-05-10 DIAGNOSIS — R112 Nausea with vomiting, unspecified: Secondary | ICD-10-CM | POA: Diagnosis not present

## 2018-05-10 DIAGNOSIS — F17200 Nicotine dependence, unspecified, uncomplicated: Secondary | ICD-10-CM | POA: Diagnosis not present

## 2018-05-11 DIAGNOSIS — D496 Neoplasm of unspecified behavior of brain: Secondary | ICD-10-CM | POA: Diagnosis not present

## 2018-05-11 DIAGNOSIS — G932 Benign intracranial hypertension: Secondary | ICD-10-CM | POA: Diagnosis not present

## 2018-05-11 DIAGNOSIS — H4710 Unspecified papilledema: Secondary | ICD-10-CM | POA: Diagnosis not present

## 2018-05-13 ENCOUNTER — Other Ambulatory Visit: Payer: Self-pay

## 2018-05-13 ENCOUNTER — Emergency Department (HOSPITAL_COMMUNITY)
Admission: EM | Admit: 2018-05-13 | Discharge: 2018-05-13 | Disposition: A | Discharge status: Home / Self Care | Payer: BLUE CROSS/BLUE SHIELD | Attending: Emergency Medicine | Admitting: Emergency Medicine

## 2018-05-13 ENCOUNTER — Encounter (HOSPITAL_COMMUNITY): Payer: Self-pay | Admitting: Emergency Medicine

## 2018-05-13 DIAGNOSIS — R51 Headache: Secondary | ICD-10-CM | POA: Insufficient documentation

## 2018-05-13 DIAGNOSIS — F17210 Nicotine dependence, cigarettes, uncomplicated: Secondary | ICD-10-CM | POA: Insufficient documentation

## 2018-05-13 DIAGNOSIS — I10 Essential (primary) hypertension: Secondary | ICD-10-CM | POA: Diagnosis not present

## 2018-05-13 DIAGNOSIS — Z79899 Other long term (current) drug therapy: Secondary | ICD-10-CM | POA: Insufficient documentation

## 2018-05-13 DIAGNOSIS — J45909 Unspecified asthma, uncomplicated: Secondary | ICD-10-CM | POA: Diagnosis not present

## 2018-05-13 DIAGNOSIS — G43909 Migraine, unspecified, not intractable, without status migrainosus: Secondary | ICD-10-CM | POA: Diagnosis not present

## 2018-05-13 DIAGNOSIS — R519 Headache, unspecified: Secondary | ICD-10-CM

## 2018-05-13 MED ORDER — ACETAZOLAMIDE ER 500 MG PO CP12: 500.0000 mg | ORAL_CAPSULE | Freq: Two times a day (BID) | ORAL | 0 refills | Status: DC

## 2018-05-13 MED ORDER — ONDANSETRON HCL 4 MG/2ML IJ SOLN
4.0000 mg | Freq: Once | INTRAMUSCULAR | Status: AC
  Administered 2018-05-13: 4 mg via INTRAVENOUS
  Filled 2018-05-13: qty 2

## 2018-05-13 MED ORDER — KETOROLAC TROMETHAMINE 30 MG/ML IJ SOLN
15.0000 mg | Freq: Once | INTRAMUSCULAR | Status: AC
  Administered 2018-05-13: 15 mg via INTRAVENOUS
  Filled 2018-05-13: qty 1

## 2018-05-13 MED ORDER — METOCLOPRAMIDE HCL 5 MG/ML IJ SOLN
10.0000 mg | Freq: Once | INTRAMUSCULAR | Status: AC
  Administered 2018-05-13: 10 mg via INTRAVENOUS
  Filled 2018-05-13: qty 2

## 2018-05-13 MED ORDER — HYDROMORPHONE HCL 1 MG/ML IJ SOLN
1.0000 mg | Freq: Once | INTRAMUSCULAR | Status: AC
  Administered 2018-05-13: 1 mg via INTRAVENOUS
  Filled 2018-05-13: qty 1

## 2018-05-13 MED ORDER — ACETAZOLAMIDE 250 MG PO TABS
500.0000 mg | ORAL_TABLET | Freq: Once | ORAL | Status: AC
  Administered 2018-05-13: 500 mg via ORAL
  Filled 2018-05-13 (×3): qty 2

## 2018-05-13 MED ORDER — SODIUM CHLORIDE 0.9 % IV SOLN
Freq: Once | INTRAVENOUS | Status: AC
  Administered 2018-05-13: 08:00:00 via INTRAVENOUS

## 2018-05-13 MED ORDER — DIPHENHYDRAMINE HCL 50 MG/ML IJ SOLN
25.0000 mg | Freq: Once | INTRAMUSCULAR | Status: AC
  Administered 2018-05-13: 25 mg via INTRAVENOUS
  Filled 2018-05-13: qty 1

## 2018-05-13 NOTE — Discharge Instructions (Addendum)
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 The medication you are being started on will hopefully help with your symptoms. You need to follow-up with a neurologist as soon as you can.

## 2018-05-13 NOTE — ED Provider Notes (Signed)
[facility] EMERGENCY DEPARTMENT Provider Note   CSN: [id number] Arrival date & time: 05/13/18  1497     History   Chief Complaint Chief Complaint  Patient presents with  . Migraine    HPI Kalayla Shadden is a 42 y.o. female.  HPI   42 year old female with headache.  Began approximately 1 week ago after traveling to [place] for vacation.  Headache is been fairly constant since then.  Multiple evaluations in various emergency rooms while in [place].  Per patient and significant other, it sounds like she had a pretty significant work-up including a CT, MRI, lumbar puncture and additional labs.  She reports temporary improvement with medications.  Currently she has been taking Toradol.  Describes a headache is diffuse but somewhat worse in the frontal region.  Photophobia and nausea.  She has vomited at times.  No back pain or neck stiffness.  No fevers.  Sometimes blurred vision.  She has had headaches previously but nothing to this degree.  Past Medical History:  Diagnosis Date  . Anxiety 03/15/2012  . Arthritis   . Asthma   . BOILS, RECURRENT 12/30/2006   Qualifier: Diagnosis of  By: Mellody Drown MD, [facility]    . DEPRESSION, MAJOR, RECURRENT 09/18/2006   Qualifier: Diagnosis of  By: Herma Ard    . Hypertension   . INCONTINENCE, STRESS, FEMALE 09/18/2006   Qualifier: Diagnosis of  By: Herma Ard    . Mass on back   . MENORRHAGIA 06/27/2009   Qualifier: Diagnosis of  By: Netty Starring  MD, Lucianne Muss    . Migraines   . VENEREAL WART 06/05/2009   Qualifier: Diagnosis of  By: Martinique, Bonnie      Patient Active Problem List   Diagnosis Date Noted  . Spondylolisthesis of lumbar region 04/03/2016  . Vaginal discharge 11/29/2013  . Low back pain 03/26/2013  . HYPERTENSION, BENIGN ESSENTIAL 06/05/2009  . Hyperlipidemia 11/07/2006  . OBESITY, NOS 09/18/2006  . TOBACCO DEPENDENCE 09/18/2006  . Migraine 09/18/2006    Past Surgical History:  Procedure Laterality  Date  . BACK SURGERY  2017   L5-S1 spinal fusion  . LIPOMA EXCISION N/A 11/26/2017   Procedure: EXCISION OF BACK LIPOMA;  Surgeon: Ralene Ok, MD;  Location: [place];  Service: General;  Laterality: N/A;  . TUBAL LIGATION       OB History   None      Home Medications    Prior to Admission medications   Medication Sig Start Date End Date Taking? Authorizing Provider  acetaminophen (TYLENOL) 500 MG tablet Take 1,000 mg by mouth 3 (three) times daily as needed for moderate pain.    [provider]  aspirin-acetaminophen-caffeine (EXCEDRIN MIGRAINE) [phone] MG per tablet Take 2 tablets by mouth daily as needed for headache or migraine.     [provider]  cyclobenzaprine (FLEXERIL) 10 MG tablet Take 1 tablet (10 mg total) by mouth 3 (three) times daily. Patient taking differently: Take 10 mg by mouth daily.  04/07/16   Ditty, Kevan Ny, MD  lisinopril-hydrochlorothiazide (PRINZIDE,ZESTORETIC) 10-12.5 MG tablet Take 1 tablet by mouth daily. 06/20/15   Patrecia Pour, MD  minocycline (MINOCIN,DYNACIN) 100 MG capsule Take 100 mg by mouth 2 (two) times daily.    [provider]  rosuvastatin (CRESTOR) 20 MG tablet Take 20 mg by mouth daily.    [provider]  traMADol (ULTRAM) 50 MG tablet Take 1 tablet (50 mg total) by mouth every 6 (six) hours as needed. 11/26/17 11/26/18  Ralene Ok, MD    Family History No family history on file.  Social History Social History   Tobacco Use  . Smoking status: Current Every Day Smoker    Packs/day: 1.00  . Smokeless tobacco: Never Used  Substance Use Topics  . Alcohol use: No  . Drug use: No     Allergies   Flagyl [metronidazole]   Review of Systems Review of Systems  All systems reviewed and negative, other than as noted in HPI.  Physical Exam Updated Vital Signs BP 136/83 (BP Location: Left Arm)   Pulse 70   Temp 98.1 F (36.7 C) (Oral)   Resp 18   Ht 5\' 4"  (1.626 m)   Wt 90.7  kg   LMP 05/07/2018 (Exact Date)   SpO2 100%   BMI 34.33 kg/m   Physical Exam  Constitutional: She is oriented to person, place, and time. She appears well-developed and well-nourished. No distress.  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  HENT:  Head: Normocephalic and atraumatic.  Eyes: Pupils are equal, round, and reactive to light. Conjunctivae and EOM are normal. Right eye exhibits no discharge. Left eye exhibits no discharge.  I cannot appreciate papilledema.  Neck: Neck supple.  No nuchal rigidity  Cardiovascular: Normal rate, regular rhythm and normal heart sounds. Exam reveals no gallop and no friction rub.  No murmur heard. Pulmonary/Chest: Effort normal and breath sounds normal. No respiratory distress.  Abdominal: Soft. She exhibits no distension. There is no tenderness.  Musculoskeletal: She exhibits no edema or tenderness.  Neurological: She is alert and oriented to person, place, and time. No cranial nerve deficit. She exhibits normal muscle tone. Coordination normal.  Skin: Skin is warm and dry.  Psychiatric: She has a normal mood and affect. Her behavior is normal. Thought content normal.  Nursing note and vitals reviewed.    ED Treatments / Results  Labs (all labs ordered are listed, but only abnormal results are displayed) Labs Reviewed - No data to display  EKG None  Radiology No results found.  Procedures Procedures (including critical care time)  Medications Ordered in ED Medications  metoCLOPramide (REGLAN) injection 10 mg (10 mg Intravenous Given 05/13/18 0759)  ketorolac (TORADOL) 30 MG/ML injection 15 mg (15 mg Intravenous Given 05/13/18 0759)  0.9 %  sodium chloride infusion ( Intravenous Stopped 05/13/18 1123)  diphenhydrAMINE (BENADRYL) injection 25 mg (25 mg Intravenous Given 05/13/18 0802)  acetaZOLAMIDE (DIAMOX) tablet 500 mg (500 mg Oral Given 05/13/18 0937)  HYDROmorphone (DILAUDID) injection 1 mg (1 mg Intravenous Given  05/13/18 1023)  ondansetron (ZOFRAN) injection 4 mg (4 mg Intravenous Given 05/13/18 1032)     Initial Impression / Assessment and Plan / ED Course  I have reviewed the triage vital signs and the nursing notes.  Pertinent labs & imaging results that were available during my care of the patient were reviewed by me and considered in my medical decision making (see chart for details).     41yF with persistent headache. She has had an extensive work-up at this point. CT w/o contrast, LP, MRI w/wo contrast (results below). I suspect she may have intracranial hypertension. Female, obese, blurred vision, MRI findings and lack of clear alternative diagnosis at this point. She reports no improvement, even transient, after her LP though. I presume no significant abnormalities but I do not have ready access to those results. Knowing her opening pressure would be helpful, if done. I can see her ED note and testing at the  [facility] hospital in [place], [place] through [place] but not the other facilities. Will try to obtain those records.    At this point, I'm not sure what additional diagnostic testing I can add from and ED standpoint. There does not appear to be an emergent etiology yet she is uncomfortable to the point of this being her 7th ED visit in less than a week for the same complaint. With the three hour time difference between here and [place], I am not going to be able to get additional records expeditiously this time of day. I think starting her on diamox and close follow-up with neurology would be a reasonable next step.   Of note, I did receive some records from OSH after patient was discharged from the emergency room today.  There is no mention of an opening pressure in the procedure note for the LP.  LP results were pretty unremarkable and there is no specific mention of an opening pressure there either.  Patient: GLORIANN, RIEDE Out: 03:58  Exam(s): MRI HEAD  W/WO Contrast     EXAM:  MR Head Without and With Intravenous Contrast    CLINICAL HISTORY:  Reason for Exam: Brain Tumor Mass per w[place]lin.    TECHNIQUE:  Magnetic resonance images of the head/brain without and with   intravenous contrast in multiple planes.    COMPARISON:  No relevant prior studies available.    FINDINGS:  Brain:No mass.No hemorrhage.No acute infarct.No abnormal   enhancement.  Ventricles:No hydrocephalus.  Bones/joints:Unremarkable.  Sinuses:Unremarkable.No acute sinusitis.  Mastoid air cells:No effusion.  Orbits: Prominent optic nerve sheath bilaterally.Mildly flattened   posterior globes bilaterally.  Other: Incidental note of multiple arachnoid granulations in the   transverse sinuses bilaterally, more in the left side.The right distal   transverse sinus appears narrowed.  Final Clinical Impressions(s) / ED Diagnoses   Final diagnoses:  Bad headache    ED Discharge Orders    None       Virgel Manifold, MD 05/14/18 [phone]

## 2018-05-13 NOTE — ED Triage Notes (Signed)
Pt c/o of a migraine that started on 05/07/18 after getting off of an airplane. Visited 6 hospitals while in [place]. Has been taking Toradol and phenergan. Last taken about 20 minutes ago. Nothing has helped

## 2018-05-14 ENCOUNTER — Other Ambulatory Visit: Payer: Self-pay

## 2018-05-14 ENCOUNTER — Ambulatory Visit: Payer: BLUE CROSS/BLUE SHIELD | Admitting: Neurology

## 2018-05-14 ENCOUNTER — Encounter: Payer: Self-pay | Admitting: Neurology

## 2018-05-14 VITALS — BP 132/96 | HR 107 | Resp 20 | Ht 64.0 in | Wt 196.0 lb

## 2018-05-14 DIAGNOSIS — G43709 Chronic migraine without aura, not intractable, without status migrainosus: Secondary | ICD-10-CM

## 2018-05-14 MED ORDER — NARATRIPTAN HCL 2.5 MG PO TABS: 2.5000 mg | ORAL_TABLET | Freq: Two times a day (BID) | ORAL | 3 refills | Status: DC | PRN

## 2018-05-14 MED ORDER — PROMETHAZINE HCL 25 MG PO TABS: 25.0000 mg | ORAL_TABLET | Freq: Four times a day (QID) | ORAL | 3 refills | Status: DC | PRN

## 2018-05-14 MED ORDER — TOPIRAMATE 25 MG PO TABS: ORAL_TABLET | ORAL | 3 refills | Status: DC

## 2018-05-14 MED ORDER — PREDNISONE 10 MG PO TABS: ORAL_TABLET | ORAL | 0 refills | Status: DC

## 2018-05-14 NOTE — Progress Notes (Addendum)
Reason for visit: Migraine headache  Referring physician: Dr. Francie Massing is a 42 y.o. female  History of present illness:  Kristin Perry is a 42 year old right-handed white female with a history of migraine headaches for about 25 years.  Kristin Perry has had headaches in Kristin past lasting up to 3 days, she indicates that stress is a major activator for her.  Kristin Perry typically has about 1 migraine headache a month, Kristin headaches usually are in Kristin frontal areas bilaterally and may be associated with photophobia and phonophobia, sometimes some nausea and vomiting may occur.  Kristin Perry took a trip out to [place] 1 week ago, Kristin morning after Kristin airline flight she woke up with a severe headache associated with pain in Kristin back of Kristin head and with photophobia, sometimes in Kristin left temporal region.  Kristin Perry had severe nausea and vomiting.  Kristin Perry went to Kristin emergency room for an evaluation and underwent MRI of Kristin brain that was unremarkable, lumbar puncture was done, I do not have a record of Kristin opening pressure.  She was told that she had pseudotumor cerebri.  Kristin Perry was noted to be hypertensive in Kristin ER.  Kristin Perry has been given IV medications such as Toradol, Reglan, and Benadryl.  Kristin Perry continues to have ongoing severe headache.  Typically she will drink up to 6 Dr. Samson Frederic a day and she drinks 2 to 3 cups of coffee daily.  She will take Excedrin Migraine for her usual migraine with good improvement.  Kristin Perry is sent to this office on an urgent basis for her severe headaches.  She does not report any numbness or weakness of Kristin face, arms, or legs.  She denies issues controlling Kristin bowels or Kristin bladder, she has had some dizziness with Kristin headache.  Past Medical History:  Diagnosis Date  . Anxiety 03/15/2012  . Arthritis   . Asthma   . BOILS, RECURRENT 12/30/2006   Qualifier: Diagnosis of  By: Mellody Drown MD, [facility]    . DEPRESSION, MAJOR,  RECURRENT 09/18/2006   Qualifier: Diagnosis of  By: Herma Ard    . Hypertension   . INCONTINENCE, STRESS, FEMALE 09/18/2006   Qualifier: Diagnosis of  By: Herma Ard    . Mass on back   . MENORRHAGIA 06/27/2009   Qualifier: Diagnosis of  By: Netty Starring  MD, Lucianne Muss    . Migraines   . VENEREAL WART 06/05/2009   Qualifier: Diagnosis of  By: Martinique, Bonnie      Past Surgical History:  Procedure Laterality Date  . BACK SURGERY  2017   L5-S1 spinal fusion  . LIPOMA EXCISION N/A 11/26/2017   Procedure: EXCISION OF BACK LIPOMA;  Surgeon: Ralene Ok, MD;  Location: [place];  Service: General;  Laterality: N/A;  . TUBAL LIGATION      Family History  Problem Relation Age of Onset  . Healthy Mother   . Liver cancer Father   . Healthy Brother   . Healthy Daughter   . Healthy Daughter     Social history:  reports that she has been smoking. She has been smoking about 1.00 pack per day. She has never used smokeless tobacco. She reports that she does not drink alcohol or use drugs.  Medications:  Prior to Admission medications   Medication Sig Start Date End Date Taking? Authorizing Provider  acetaminophen (TYLENOL) 500 MG tablet Take 1,000 mg by mouth 3 (three) times daily as needed for moderate  pain.   Yes [provider]  cyclobenzaprine (FLEXERIL) 10 MG tablet Take 1 tablet (10 mg total) by mouth 3 (three) times daily. Perry taking differently: Take 10 mg by mouth daily.  04/07/16  Yes Ditty, Kevan Ny, MD  ketorolac (TORADOL) 10 MG tablet TK 1 T PO Q 6 H AS NEEDED FOR PAIN OR HEADACHE.  MDD 40MG  05/10/18  Yes [provider]  lisinopril-hydrochlorothiazide (PRINZIDE,ZESTORETIC) 10-12.5 MG tablet Take 1 tablet by mouth daily. 06/20/15  Yes Patrecia Pour, MD  minocycline (MINOCIN,DYNACIN) 100 MG capsule Take 100 mg by mouth 2 (two) times daily.   Yes [provider]  rosuvastatin (CRESTOR) 20 MG tablet Take 20 mg by mouth daily.   Yes  [provider]  traMADol (ULTRAM) 50 MG tablet Take 1 tablet (50 mg total) by mouth every 6 (six) hours as needed. 11/26/17 11/26/18 Yes Ralene Ok, MD  aspirin-acetaminophen-caffeine [facility] MIGRAINE) [phone] MG per tablet Take 2 tablets by mouth daily as needed for headache or migraine.     [provider]  naratriptan (AMERGE) 2.5 MG tablet Take 1 tablet (2.5 mg total) by mouth 2 (two) times daily as needed for migraine. 05/14/18   Kathrynn Ducking, MD  predniSONE (DELTASONE) 10 MG tablet Begin taking 6 tablets daily, taper by one tablet every other day until off Kristin medication. 05/14/18   Kathrynn Ducking, MD  promethazine (PHENERGAN) 25 MG tablet Take 1 tablet (25 mg total) by mouth every 6 (six) hours as needed for nausea or vomiting. 05/14/18   Kathrynn Ducking, MD  SUMAtriptan (IMITREX) 25 MG tablet TK 1 T PO QD PRN FOR MIGRAINE HEADACHE. MAY REPEAT DOSE AFTER 2 H UP TO A MAXIMUM OF 200MG  IN 24 H 05/09/18   [provider]  topiramate (TOPAMAX) 25 MG tablet Take one tablet at night for one week, then take 2 tablets at night for one week, then take 3 tablets at night. 05/14/18   Kathrynn Ducking, MD      Allergies  Allergen Reactions  . Flagyl [Metronidazole] Rash    ROS:  Out of a complete 14 system review of symptoms, Kristin Perry complains only of Kristin following symptoms, and all other reviewed systems are negative.  Headache Nausea, vomiting  Blood pressure (!) 132/96, pulse (!) 107, resp. rate 20, height 5\' 4"  (1.626 m), weight 196 lb (88.9 kg), last menstrual period 05/07/2018.  Physical Exam  General: Kristin Perry is alert and cooperative at Kristin time of Kristin examination.  Kristin Perry is moderately obese.  Eyes: Pupils are equal, round, and reactive to light. Discs are flat bilaterally.  Neck: Kristin neck is supple, no carotid bruits are noted.  Respiratory: Kristin respiratory examination is clear.  Cardiovascular: Kristin cardiovascular  examination reveals a regular rate and rhythm, no obvious murmurs or rubs are noted.  Skin: Extremities are without significant edema.  Neurologic Exam  Mental status: Kristin Perry is alert and oriented x 3 at Kristin time of Kristin examination. Kristin Perry has apparent normal recent and remote memory, with an apparently normal attention span and concentration ability.  Cranial nerves: Facial symmetry is present. There is good sensation of Kristin face to pinprick and soft touch bilaterally. Kristin strength of Kristin facial muscles and Kristin muscles to head turning and shoulder shrug are normal bilaterally. Speech is well enunciated, no aphasia or dysarthria is noted. Extraocular movements are full. Visual fields are full. Kristin tongue is midline, and Kristin Perry has symmetric elevation  of Kristin soft palate. No obvious hearing deficits are noted.  Motor: Kristin motor testing reveals 5 over 5 strength of all 4 extremities. Good symmetric motor tone is noted throughout.  Sensory: Sensory testing is intact to pinprick, soft touch, vibration sensation, and position sense on all 4 extremities. No evidence of extinction is noted.  Coordination: Cerebellar testing reveals good finger-nose-finger and heel-to-shin bilaterally.  Gait and station: Gait is normal. Tandem gait is slightly unsteady. Romberg is negative. No drift is seen.  Reflexes: Deep tendon reflexes are symmetric and normal bilaterally. Toes are downgoing bilaterally.   Assessment/Plan:  1.  Intractable migraine headache, status migrainosus  Kristin Perry likely does not have pseudotumor cerebri.  Kristin Perry has a long-standing history of migraine headache, her migraine likely was activated by Kristin barometric pressure changes with airline travel.  Kristin Perry will be given a prednisone Dosepak, 10 mg 12-day pack.  She will be given Amerge to take if needed, she will have a Depacon injection and Phenergan injection today.  She will be taken off of Kristin Diamox and  started on Topamax, working up on Kristin dose.  She will follow-up in 2 months.  Jill Alexanders MD 05/14/2018 3:02 PM  [facility] [address] [place], [place] [address]  Phone [phone] Fax [phone]

## 2018-05-14 NOTE — Patient Instructions (Signed)
We will go on prednisone for the next 12 days, and start Topamax at night to help prevent the headache.   Use Amerge 2.5 mg tablets if needed for the headache and phenergan for nausea.

## 2018-05-15 ENCOUNTER — Other Ambulatory Visit: Payer: Self-pay

## 2018-05-15 ENCOUNTER — Telehealth: Payer: Self-pay | Admitting: Neurology

## 2018-05-15 ENCOUNTER — Encounter (HOSPITAL_COMMUNITY): Payer: Self-pay | Admitting: *Deleted

## 2018-05-15 ENCOUNTER — Inpatient Hospital Stay (HOSPITAL_COMMUNITY): Payer: BLUE CROSS/BLUE SHIELD

## 2018-05-15 ENCOUNTER — Inpatient Hospital Stay (HOSPITAL_COMMUNITY)
Admission: EM | Admit: 2018-05-15 | Discharge: 2018-05-18 | DRG: 103 | Disposition: A | Discharge status: Home / Self Care | Payer: BLUE CROSS/BLUE SHIELD | Attending: Internal Medicine | Admitting: Internal Medicine

## 2018-05-15 DIAGNOSIS — Z79899 Other long term (current) drug therapy: Secondary | ICD-10-CM | POA: Diagnosis not present

## 2018-05-15 DIAGNOSIS — R51 Headache: Secondary | ICD-10-CM | POA: Diagnosis not present

## 2018-05-15 DIAGNOSIS — E860 Dehydration: Secondary | ICD-10-CM | POA: Diagnosis not present

## 2018-05-15 DIAGNOSIS — G43909 Migraine, unspecified, not intractable, without status migrainosus: Secondary | ICD-10-CM | POA: Diagnosis present

## 2018-05-15 DIAGNOSIS — E876 Hypokalemia: Secondary | ICD-10-CM | POA: Diagnosis not present

## 2018-05-15 DIAGNOSIS — G43901 Migraine, unspecified, not intractable, with status migrainosus: Principal | ICD-10-CM | POA: Diagnosis present

## 2018-05-15 DIAGNOSIS — R42 Dizziness and giddiness: Secondary | ICD-10-CM | POA: Diagnosis not present

## 2018-05-15 DIAGNOSIS — F17210 Nicotine dependence, cigarettes, uncomplicated: Secondary | ICD-10-CM | POA: Diagnosis present

## 2018-05-15 DIAGNOSIS — E785 Hyperlipidemia, unspecified: Secondary | ICD-10-CM | POA: Diagnosis not present

## 2018-05-15 DIAGNOSIS — R112 Nausea with vomiting, unspecified: Secondary | ICD-10-CM | POA: Diagnosis not present

## 2018-05-15 DIAGNOSIS — Z888 Allergy status to other drugs, medicaments and biological substances status: Secondary | ICD-10-CM

## 2018-05-15 DIAGNOSIS — I10 Essential (primary) hypertension: Secondary | ICD-10-CM | POA: Diagnosis not present

## 2018-05-15 DIAGNOSIS — D72829 Elevated white blood cell count, unspecified: Secondary | ICD-10-CM

## 2018-05-15 DIAGNOSIS — D582 Other hemoglobinopathies: Secondary | ICD-10-CM

## 2018-05-15 DIAGNOSIS — J45909 Unspecified asthma, uncomplicated: Secondary | ICD-10-CM | POA: Diagnosis present

## 2018-05-15 DIAGNOSIS — Z981 Arthrodesis status: Secondary | ICD-10-CM | POA: Diagnosis not present

## 2018-05-15 DIAGNOSIS — G43811 Other migraine, intractable, with status migrainosus: Secondary | ICD-10-CM

## 2018-05-15 LAB — CBC
HEMATOCRIT: 49.7 % — AB (ref 36.0–46.0)
Hemoglobin: 16.6 g/dL — ABNORMAL HIGH (ref 12.0–15.0)
MCH: 31.3 pg (ref 26.0–34.0)
MCHC: 33.4 g/dL (ref 30.0–36.0)
MCV: 93.8 fL (ref 80.0–100.0)
NRBC: 0 % (ref 0.0–0.2)
PLATELETS: 289 10*3/uL (ref 150–400)
RBC: 5.3 MIL/uL — ABNORMAL HIGH (ref 3.87–5.11)
RDW: 12.6 % (ref 11.5–15.5)
WBC: 12.2 10*3/uL — ABNORMAL HIGH (ref 4.0–10.5)

## 2018-05-15 LAB — BASIC METABOLIC PANEL
Anion gap: 11 (ref 5–15)
BUN: 12 mg/dL (ref 6–20)
CHLORIDE: 107 mmol/L (ref 98–111)
CO2: 19 mmol/L — AB (ref 22–32)
CREATININE: 0.86 mg/dL (ref 0.44–1.00)
Calcium: 9.7 mg/dL (ref 8.9–10.3)
GFR calc non Af Amer: >60 mL/min (ref >60)
GLUCOSE: 93 mg/dL (ref 70–99)
Potassium: 2.9 mmol/L — ABNORMAL LOW (ref 3.5–5.1)
Sodium: 137 mmol/L (ref 135–145)

## 2018-05-15 LAB — URINALYSIS, ROUTINE W REFLEX MICROSCOPIC
Bilirubin Urine: NEGATIVE
GLUCOSE, UA: NEGATIVE mg/dL
KETONES UR: 20 mg/dL — AB
Leukocytes, UA: NEGATIVE
Nitrite: NEGATIVE
PROTEIN: NEGATIVE mg/dL
Specific Gravity, Urine: 1.017 (ref 1.005–1.030)
pH: 5 (ref 5.0–8.0)

## 2018-05-15 LAB — I-STAT BETA HCG BLOOD, ED (MC, WL, AP ONLY): I-stat hCG, quantitative: <5 m[IU]/mL (ref <5)

## 2018-05-15 MED ORDER — SODIUM CHLORIDE 0.9 % IV BOLUS
1000.0000 mL | Freq: Once | INTRAVENOUS | Status: AC
  Administered 2018-05-15: 1000 mL via INTRAVENOUS

## 2018-05-15 MED ORDER — POTASSIUM CHLORIDE 10 MEQ/100ML IV SOLN
10.0000 meq | INTRAVENOUS | Status: AC
  Administered 2018-05-15 (×2): 10 meq via INTRAVENOUS
  Filled 2018-05-15 (×2): qty 100

## 2018-05-15 MED ORDER — DIHYDROERGOTAMINE MESYLATE 1 MG/ML IJ SOLN
0.5000 mg | Freq: Once | INTRAMUSCULAR | Status: DC
  Filled 2018-05-15: qty 0.5

## 2018-05-15 MED ORDER — PROMETHAZINE HCL 25 MG/ML IJ SOLN
25.0000 mg | Freq: Four times a day (QID) | INTRAMUSCULAR | Status: DC | PRN
  Administered 2018-05-15 – 2018-05-17 (×5): 25 mg via INTRAVENOUS
  Filled 2018-05-15 (×5): qty 1

## 2018-05-15 MED ORDER — LORAZEPAM 2 MG/ML IJ SOLN
1.0000 mg | Freq: Once | INTRAMUSCULAR | Status: AC
  Administered 2018-05-15: 1 mg via INTRAVENOUS
  Filled 2018-05-15: qty 1

## 2018-05-15 MED ORDER — METOCLOPRAMIDE HCL 5 MG/ML IJ SOLN
10.0000 mg | Freq: Every day | INTRAMUSCULAR | Status: DC
  Administered 2018-05-16 (×2): 10 mg via INTRAVENOUS
  Filled 2018-05-15 (×2): qty 2

## 2018-05-15 MED ORDER — SODIUM CHLORIDE 0.9 % IV SOLN
INTRAVENOUS | Status: DC
  Administered 2018-05-16: 03:00:00 via INTRAVENOUS
  Filled 2018-05-15 (×39): qty 100

## 2018-05-15 MED ORDER — ACETAMINOPHEN 500 MG PO TABS: 1000.0000 mg | ORAL_TABLET | Freq: Three times a day (TID) | ORAL | Status: DC | PRN

## 2018-05-15 MED ORDER — SODIUM CHLORIDE 0.9 % IV SOLN
INTRAVENOUS | Status: DC
  Administered 2018-05-16 (×2): via INTRAVENOUS

## 2018-05-15 MED ORDER — ROSUVASTATIN CALCIUM 10 MG PO TABS
20.0000 mg | ORAL_TABLET | Freq: Every day | ORAL | Status: DC
  Administered 2018-05-16 – 2018-05-18 (×3): 20 mg via ORAL
  Filled 2018-05-15 (×4): qty 2

## 2018-05-15 MED ORDER — ONDANSETRON HCL 4 MG/2ML IJ SOLN
4.0000 mg | Freq: Three times a day (TID) | INTRAMUSCULAR | Status: DC | PRN
  Administered 2018-05-15: 4 mg via INTRAVENOUS
  Filled 2018-05-15: qty 2

## 2018-05-15 MED ORDER — DIHYDROERGOTAMINE MESYLATE 1 MG/ML IJ SOLN
0.5000 mg | Freq: Three times a day (TID) | INTRAMUSCULAR | Status: DC
  Filled 2018-05-15 (×2): qty 0.5

## 2018-05-15 MED ORDER — POTASSIUM CHLORIDE CRYS ER 20 MEQ PO TBCR
40.0000 meq | EXTENDED_RELEASE_TABLET | Freq: Once | ORAL | Status: AC
  Administered 2018-05-15: 40 meq via ORAL
  Filled 2018-05-15: qty 2

## 2018-05-15 MED ORDER — SODIUM CHLORIDE 0.9 % IV SOLN
250.0000 mg | Freq: Every day | INTRAVENOUS | Status: AC
  Administered 2018-05-15 – 2018-05-17 (×3): 250 mg via INTRAVENOUS
  Filled 2018-05-15 (×3): qty 2

## 2018-05-15 NOTE — ED Provider Notes (Signed)
[facility] EMERGENCY DEPARTMENT Provider Note   CSN: [id number] Arrival date & time: 05/15/18  1053     History   Chief Complaint Chief Complaint  Patient presents with  . Migraine    HPI Kristin Perry is a 42 y.o. female with a of asthma, migraines, HTN, lumbar spondylolisthesis s/p L5-S1 spinal fusion, and obesity who presents to the emergency department with a chief complaint of headache, nausea, and vomiting.  The patient traveled to [place] 1 week ago.  She woke the morning after the flight with a severe headache in the back of the head and intermittently in the left temporal area with associated photophobia, nausea, and vomiting.  She was evaluated in the ER in [place] and had a normal MRI brain.  Apparently, an LP was performed and she was told that she had pseudotumor cerebri as she was also noted to be hypertensive.  She was treated with Toradol, Reglan, and Benadryl.  She was treated at [facility] ED on 10/20-21/19 where she had an MRI brain performed.  Per chart review, she had also previously been evaluated at [facility] ED and in ED in [facility].  The patient was treated with Dilaudid in the ED and her medical record noted improvement of her headache.  She was discharged with 60 tablets of oral Toradol.  She was seen by Dr. Jannifer Franklin, neurology in the clinic yesterday.  He did not feel that the patient had pseudotumor cerebri.  He suspects that her migraine was likely activated by barometric pressure changes with an airline travel.  She was discharged with a prednisone Dosepak, 10 mg for 12 days.  She was given Amerge for home use and was given Depacon and Phenergan injection in the office.  She was also changed off of her Diamox and started on Topamax with increasing dose.  The patient called Dr. Tobey Grim office this morning as she has now been vomiting for 8 days and has been able to keep down food and fluids in addition to her blood  pressure and migraine medications.  She was advised to come to the ED for further work-up and evaluation.  Patient reports mild improvement in her headache over the last week.  States she has not been able to keep down any fluid or foods since the onset of emesis a days ago.  She endorses mild dizziness with standing that began over the last few days.  She also notes bruises to her bilateral arms.  Although 1 of the bruises looks consistent with a previous IV site, she also notes that several of the bruises or from the area where a blood pressure cuff was placed during 1 of her previous ED visits.  She also endorses neck pain with mild stiffness.  She denies hematemesis, melena, hematochezia, chest pain, dyspnea, diarrhea, fever, chills, visual changes, otalgia, URI symptoms, numbness, numbness, slurred speech, facial asymmetry, syncope, or GU symptoms.  She reports she did have some tingling of her upper lip yesterday that has since resolved today she suspects was secondary to Topamax, which was a new medication for her.  The patient has a long-standing history of migraine headaches for last 25 years.  Takes will sometimes last up to 3 days and stress is a major trigger for onset of migraines.  Typically, the patient has had about 1 headache a month, usually in the bilateral frontal area +/-photophobia and phonophobia.  Intermittently, she may develop nausea and vomiting.  She is a current, 1  pack/day every day smoker.  She drinks up to 6 Dr. Samson Frederic a day and 2 to 3 cups of coffee daily.  Has been able to take Excedrin Migraine in the past for her usual headaches with good improvement.  MP was 05/07/2018.  No known family history of autoimmune or rheumatological disorders.  The history is provided by the patient. No language interpreter was used.    Past Medical History:  Diagnosis Date  . Anxiety 03/15/2012  . Arthritis   . Asthma   . BOILS, RECURRENT 12/30/2006   Qualifier: Diagnosis of  By:  Mellody Drown MD, [facility]    . DEPRESSION, MAJOR, RECURRENT 09/18/2006   Qualifier: Diagnosis of  By: Herma Ard    . Hypertension   . INCONTINENCE, STRESS, FEMALE 09/18/2006   Qualifier: Diagnosis of  By: Herma Ard    . Mass on back   . MENORRHAGIA 06/27/2009   Qualifier: Diagnosis of  By: Netty Starring  MD, Lucianne Muss    . Migraines   . VENEREAL WART 06/05/2009   Qualifier: Diagnosis of  By: Martinique, Bonnie      Patient Active Problem List   Diagnosis Date Noted  . Migraine headache 05/15/2018  . Spondylolisthesis of lumbar region 04/03/2016  . Vaginal discharge 11/29/2013  . Low back pain 03/26/2013  . HYPERTENSION, BENIGN ESSENTIAL 06/05/2009  . Hyperlipidemia 11/07/2006  . OBESITY, NOS 09/18/2006  . TOBACCO DEPENDENCE 09/18/2006  . Migraine 09/18/2006    Past Surgical History:  Procedure Laterality Date  . BACK SURGERY  2017   L5-S1 spinal fusion  . LIPOMA EXCISION N/A 11/26/2017   Procedure: EXCISION OF BACK LIPOMA;  Surgeon: Ralene Ok, MD;  Location: [facility];  Service: General;  Laterality: N/A;  . TUBAL LIGATION       OB History   None      Home Medications    Prior to Admission medications   Medication Sig Start Date End Date Taking? Authorizing Provider  acetaminophen (TYLENOL) 500 MG tablet Take 1,000 mg by mouth 3 (three) times daily as needed for moderate pain.   Yes [provider]  aspirin-acetaminophen-caffeine (EXCEDRIN MIGRAINE) [phone] MG per tablet Take 2 tablets by mouth daily as needed for headache or migraine.    Yes [provider]  lisinopril-hydrochlorothiazide (PRINZIDE,ZESTORETIC) 10-12.5 MG tablet Take 1 tablet by mouth daily. 06/20/15  Yes Patrecia Pour, MD  minocycline (MINOCIN,DYNACIN) 100 MG capsule Take 100 mg by mouth 2 (two) times daily.   Yes [provider]  promethazine (PHENERGAN) 25 MG tablet Take 1 tablet (25 mg total) by mouth every 6 (six) hours as needed for nausea or vomiting. 05/14/18   Yes Kathrynn Ducking, MD  rosuvastatin (CRESTOR) 20 MG tablet Take 20 mg by mouth daily.   Yes [provider]  cyclobenzaprine (FLEXERIL) 10 MG tablet Take 1 tablet (10 mg total) by mouth 3 (three) times daily. Patient not taking: Reported on 05/15/2018 04/07/16   Ditty, Kevan Ny, MD  naratriptan (AMERGE) 2.5 MG tablet Take 1 tablet (2.5 mg total) by mouth 2 (two) times daily as needed for migraine. Patient not taking: Reported on 05/15/2018 05/14/18   Kathrynn Ducking, MD  predniSONE (DELTASONE) 10 MG tablet Begin taking 6 tablets daily, taper by one tablet every other day until off the medication. Patient not taking: Reported on 05/15/2018 05/14/18   Kathrynn Ducking, MD  topiramate (TOPAMAX) 25 MG tablet Take one tablet at night for one week, then take 2 tablets at night for  one week, then take 3 tablets at night. Patient not taking: Reported on 05/15/2018 05/14/18   Kathrynn Ducking, MD  traMADol (ULTRAM) 50 MG tablet Take 1 tablet (50 mg total) by mouth every 6 (six) hours as needed. Patient not taking: Reported on 05/15/2018 11/26/17 11/26/18  Ralene Ok, MD    Family History Family History  Problem Relation Age of Onset  . Healthy Mother   . Liver cancer Father   . Healthy Brother   . Healthy Daughter   . Healthy Daughter     Social History Social History   Tobacco Use  . Smoking status: Current Every Day Smoker    Packs/day: 1.00  . Smokeless tobacco: Never Used  Substance Use Topics  . Alcohol use: No  . Drug use: No     Allergies   Flagyl [metronidazole]   Review of Systems Review of Systems  Constitutional: Negative for activity change, chills and fever.  HENT: Negative for congestion, sinus pressure, sinus pain and sore throat.   Eyes: Negative for visual disturbance.  Respiratory: Positive for wheezing. Negative for cough and shortness of breath.   Cardiovascular: Negative for chest pain, palpitations and leg swelling.    Gastrointestinal: Positive for nausea and vomiting. Negative for abdominal pain.  Genitourinary: Negative for dysuria.  Musculoskeletal: Positive for neck pain and neck stiffness. Negative for back pain.  Skin: Negative for rash.  Allergic/Immunologic: Negative for immunocompromised state.  Neurological: Positive for dizziness and headaches. Negative for facial asymmetry, weakness, light-headedness and numbness.  Psychiatric/Behavioral: Negative for confusion.   Physical Exam Updated Vital Signs BP (!) 142/85   Pulse 75   Temp 98.1 F (36.7 C) (Oral)   Resp 16   Ht 5\' 4"  (1.626 m)   Wt 88.5 kg   LMP 05/07/2018 (Exact Date)   SpO2 100%   BMI 33.47 kg/m   Physical Exam  Constitutional: She is oriented to person, place, and time. No distress.  HENT:  Head: Normocephalic.  Right Ear: External ear normal.  Left Ear: External ear normal.  Eyes: Conjunctivae are normal.  Cardiovascular: Normal rate and regular rhythm. Exam reveals no gallop and no friction rub.  No murmur heard. Pulmonary/Chest: Effort normal. No stridor. No respiratory distress. She has wheezes. She has no rales. She exhibits no tenderness.  Right sided and expiratory wheezes in the mid fields and bases.  Abdominal: Soft. She exhibits no distension and no mass. There is no tenderness. There is no rebound and no guarding. No hernia.  Musculoskeletal: She exhibits no edema, tenderness or deformity.  Diffuse midline tenderness to palpation to the spinous processes of the cervical and thoracic spine.  No tenderness to the spinous processes of the lumbar spine.  Neurological: She is alert and oriented to person, place, and time.  Positive Romberg.  Cranial nerves II through XII are grossly intact. 5 out of 5 strength of the large muscle groups of the bilateral upper and lower extremities. Ambulatory without difficulty. GCS 15. ANO x4 Moves all 4 extremities. No cogwheeling or clonus bilaterally. Speaks in  complete, fluent sentences. Follows simple and complex commands.  Skin: Skin is warm. No rash noted. She is not diaphoretic.  Several areas of large bruises noted to the bilateral upper extremities.  Psychiatric: Her behavior is normal.  Nursing note and vitals reviewed.    ED Treatments / Results  Labs (all labs ordered are listed, but only abnormal results are displayed) Labs Reviewed  BASIC METABOLIC PANEL - Abnormal; Notable for  the following components:      Result Value   Potassium 2.9 (*)    CO2 19 (*)    All other components within normal limits  CBC - Abnormal; Notable for the following components:   WBC 12.2 (*)    RBC 5.30 (*)    Hemoglobin 16.6 (*)    HCT 49.7 (*)    All other components within normal limits  URINALYSIS, ROUTINE W REFLEX MICROSCOPIC - Abnormal; Notable for the following components:   Hgb urine dipstick SMALL (*)    Ketones, ur 20 (*)    Bacteria, UA RARE (*)    All other components within normal limits  I-STAT BETA HCG BLOOD, ED ([facility], [facility], [facility] ONLY)    EKG EKG Interpretation  Date/Time:  Friday May 15 2018 16:32:17 EDT Ventricular Rate:  80 PR Interval:    QRS Duration: 114 QT Interval:  398 QTC Calculation: 460 R Axis:   58 Text Interpretation:  Sinus rhythm Borderline intraventricular conduction delay RSR' in V1 or V2, right VCD or RVH ST elev, probable normal early repol pattern Confirmed by Nat Christen [phone]) on 05/15/2018 6:00:36 PM   Radiology No results found.  MRI BRAIN WWO CONTRAST from 10/21 from [facility] Final Result IMPRESSION:   1. No acute infarct, hemorrhage, or hydrocephalus. No mass. 2. Mildly flattened orbital globes bilaterally with prominent optic  nerve sheath. Correlate with papilledema. 3. there are multiple arachnoid granulations in the transverse sinuses  bilaterally, more so on the left (resulting in narrowing). There is also  narrowing of the right distal transverse sinus, which may be  congenital.  Given this finding and orbital findings, Correlate with intracranial  hypertension.  Procedures Procedures (including critical care time)  Medications Ordered in ED Medications  ondansetron (ZOFRAN) injection 4 mg (has no administration in time range)  potassium chloride 10 mEq in 100 mL IVPB (has no administration in time range)  sodium chloride 0.9 % bolus 1,000 mL (1,000 mLs Intravenous New Bag/Given 05/15/18 1652)  LORazepam (ATIVAN) injection 1 mg (1 mg Intravenous Given 05/15/18 1701)     Initial Impression / Assessment and Plan / ED Course  I have reviewed the triage vital signs and the nursing notes.  Pertinent labs & imaging results that were available during my care of the patient were reviewed by me and considered in my medical decision making (see chart for details).     42 year old female with a history of asthma, migraines, HTN, lumbar spondylolisthesis s/p L5-S1 spinal fusion, and obesity presenting with headache, nausea, and vomiting for 8 days.  The patient was discussed and independently evaluated by Dr. Lacinda Axon, attending physician.  The patient had 3 visits at 3 separate EDs in [place] over the last week as the headache began the day after her flight to [place].  The impression of the MRI performed at [facility], which was normal. See impression above.  Administrative staff in the ED is contacting [facility] in [place] to obtain ED records and labs as the patient had an LP performed during this visit and head CT.  There was some concern over the LP results and the patient was thought to have had pseudotumor cerebri as discussed in neurologist Dr. Jannifer Franklin' note yesterday when she was seen in the clinic;however he did not feel this was the case.  Suspected status migrainosus secondary to barometric pressure changes from recent flight.  Spoke with Dr. Lorraine Lax.  Will check basic labs as the patient has  had nonbloody,  nonbilious emesis for 8 days.  She received valproic acid injection in the neurology office yesterday with no improvement in her symptoms.  On her neurologic exam, she had a positive Romberg sign.  I suspect this is due to dehydration.  IV fluids have been given.  Labs are notable for hypokalemia at 2.9.  IV potassium chloride given in the ED.  Consult to the hospitalist team for admission and spoke with Dr. Marlowe Sax who will accept the patient.  Neurology will plan to follow the patient and will place orders for DHE.   The patient appears reasonably stabilized for admission considering the current resources, flow, and capabilities available in the ED at this time, and I doubt any other [facility] requiring further screening and/or treatment in the ED prior to admission.  Final Clinical Impressions(s) / ED Diagnoses   Final diagnoses:  Status migrainosus    ED Discharge Orders    None       Joanne Gavel, PA-C 05/15/18 1933    Nat Christen, MD 05/16/18 2208

## 2018-05-15 NOTE — Consult Note (Signed)
Plan  Repeat CT head If negative, and EKG normal can start DHE      Requesting Physician: Joline Maxcy PA-C    Chief Complaint: Status Migrainosus   History obtained from: Patient and Chart    HPI:                                                                                                                                       Kristin Perry is an 42 y.o. female with past medical history of migraines for over 25 years presents to the emergency room after multiple ER visits for continuous headache and nausea.  Patient was at her baseline until 1 week ago when she flew to [place] for vacation.  Upon leaving the airplane she started having headache.  She went to 3 different ERs while in [place] and received multiple migraine cocktails, pain medications including Dilaudid.  She underwent an MRI brain at [facility] with no mass, however her right distal transverse appeared narrowed and her optic lobe were mildly flatten with prominent optic nerve sheaths. She was diagnosed to have intracranial hypertension, although LP results are currently not available.  She was started on Diamox, however headache has not improved.  She was seen by Dr. Margette Fast in the neurology clinic who felt that she did not have papilledema.  Patient received injection of Depakote and dexamethasone in the clinic with no improvement.  Patient states the Depakote actually made her feel worse.   She denies any other symptoms like vision blurring, weakness or sensory symptoms.    Continues to have intractable vomiting and unable to keep down medications patient called Dr. Tobey Grim office.  He recommended patient come to the emergency room for admission for status migrainosus and recommended IV fluids, antiemetic therapy and 3-day DHE protocol.  Her migraine history includes headaches usually lasting for a day relieved with Excedrin Migraine.  She gets 1-2 episodes per month.  She would have to go to the  emergency room for migraine cocktails in the past, however has not been to the ER for several years prior to this.  She does not remember what she used to get the past for her migraine attacks.  In any preventative agent  Past Medical History:  Diagnosis Date  . Anxiety 03/15/2012  . Arthritis   . Asthma   . BOILS, RECURRENT 12/30/2006   Qualifier: Diagnosis of  By: Mellody Drown MD, [facility]    . DEPRESSION, MAJOR, RECURRENT 09/18/2006   Qualifier: Diagnosis of  By: Herma Ard    . Hypertension   . INCONTINENCE, STRESS, FEMALE 09/18/2006   Qualifier: Diagnosis of  By: Herma Ard    . Mass on back   . MENORRHAGIA 06/27/2009   Qualifier: Diagnosis of  By: Netty Starring  MD, Lucianne Muss    . Migraines   . VENEREAL WART 06/05/2009   Qualifier: Diagnosis of  By: Martinique, Bonnie  Past Surgical History:  Procedure Laterality Date  . BACK SURGERY  2017   L5-S1 spinal fusion  . LIPOMA EXCISION N/A 11/26/2017   Procedure: EXCISION OF BACK LIPOMA;  Surgeon: Ralene Ok, MD;  Location: [facility];  Service: General;  Laterality: N/A;  . TUBAL LIGATION      Family History  Problem Relation Age of Onset  . Healthy Mother   . Liver cancer Father   . Healthy Brother   . Healthy Daughter   . Healthy Daughter    Social History:  reports that she has been smoking. She has been smoking about 1.00 pack per day. She has never used smokeless tobacco. She reports that she does not drink alcohol or use drugs.  Allergies:  Allergies  Allergen Reactions  . Flagyl [Metronidazole] Rash    Medications:                                                                                                                        I reviewed home medications   ROS:                                                                                                                                     14 systems reviewed and negative except above   Examination:                                                                                                       General: Appears well-developed and well-nourished.  Appears in discomfort. Psych: Affect appropriate to situation Eyes: No scleral injection HENT: No OP obstrucion Head: Normocephalic.  Cardiovascular: Normal rate and regular rhythm.  Respiratory: Effort normal and breath sounds normal to anterior ascultation GI: Soft.  No distension. There is no tenderness.  Skin: WDI    Neurological Examination Mental Status: Alert, oriented, thought content appropriate.  Speech fluent without evidence of aphasia. Able to follow 3 step commands without difficulty. Fundus examination: Able to visualize fundus both  eyes as patient unable to cooperate due to photosensitivity Cranial Nerves: II: Visual fields grossly normal,  III,IV, VI: ptosis not present, extra-ocular motions intact bilaterally, pupils equal, round, reactive to light and accommodation V,VII: smile symmetric, facial light touch sensation normal bilaterally VIII: hearing normal bilaterally IX,X: uvula rises symmetrically XI: bilateral shoulder shrug XII: midline tongue extension Motor: Right : Upper extremity   5/5    Left:     Upper extremity   5/5  Lower extremity   5/5     Lower extremity   5/5 Tone and bulk:normal tone throughout; no atrophy noted Sensory: Pinprick and light touch intact throughout, bilaterally Deep Tendon Reflexes: 2+ and symmetric throughout Plantars: Right: downgoing   Left: downgoing Cerebellar: normal finger-to-nose, normal rapid alternating movements and normal heel-to-shin test Gait:did not assess gait     Lab Results: Basic Metabolic Panel: Recent Labs  Lab 05/15/18 1653  NA 137  K 2.9*  CL 107  CO2 19*  GLUCOSE 93  BUN 12  CREATININE 0.86  CALCIUM 9.7    CBC: Recent Labs  Lab 05/15/18 1653  WBC 12.2*  HGB 16.6*  HCT 49.7*  MCV 93.8  PLT 289    Coagulation Studies: No results for input(s): LABPROT, INR in the last 72  hours.  Imaging: No results found.   CT head : ordered    ASSESSMENT AND PLAN  42 -year-old female with intractable headache, nausea and vomiting.  History of migraines and likely presentation is status migrainosus.  Presented to the ED for IV fluids, antiemetics and DHE protocol.  If she does not respond, will need to re-work her up for secondary causes for intractable headache such as sinus venous thrombosis. MRI I brain done in [place] did not show any evidence of obvious thrombosis, however dedicated MRV not performed.  Clinically, her presentation less likely is idiopathic intracranial hypertension, but will consider repeat LP not better after DHE.  Status migrainosus  CT head  EKG  Metaclopromide 10mg  IV q8h Zofran 4mg  IV Q8h DHE protocol  IV fluids at 100 cc/hr  Sushanth Aroor [facility] Pager Number [phone]

## 2018-05-15 NOTE — H&P (Signed)
History and Physical    Kristin Perry IEP:[id number] DOB: 08-28-1975 DOA: 05/15/2018  PCP: Donald Prose, MD Patient coming from: Home  Chief Complaint: Headaches, nausea, vomiting  HPI: Kristin Perry is a 42 y.o. female with medical history significant of migraine headaches, asthma, depression, hypertension presenting to the hospital for evaluation of headaches, nausea, and vomiting.  Patient states she has been having occipital sharp, constant, 10 out of 10 intensity headaches associated with nausea, vomiting, and photophobia for the past 8 days.  No neck stiffness.  States she was in [place] week ago and seen in 3 different emergency rooms over there.  An MRI was done and she was told it was normal.  In addition, an LP was done in [place] and patient was told that it was normal.  She returned to [place] 2 days ago and has continued to have symptoms.  She was seen by Dr. Jannifer Franklin yesterday and he did not think her presentation was consistent with pseudotumor cerebri.  She was prescribed Depakote and Phenergan which patient states made her even more nauseous.  Dr. Jannifer Franklin advised her to come into the hospital today for a 3-day admission to get treatments with DHE, antiemetic therapy, and and IV Solu-Medrol.  ED Course: Hemodynamically stable.  White count 12.2.  Hemoglobin 16.6.  UA not suggestive of infection. Neurology Dr. Rory Percy paged, recommendations pending. [facility] paged to admit.   Review of Systems: As per HPI otherwise 10 point review of systems negative.  Past Medical History:  Diagnosis Date  . Anxiety 03/15/2012  . Arthritis   . Asthma   . BOILS, RECURRENT 12/30/2006   Qualifier: Diagnosis of  By: Mellody Drown MD, [facility]    . DEPRESSION, MAJOR, RECURRENT 09/18/2006   Qualifier: Diagnosis of  By: Herma Ard    . Hypertension   . INCONTINENCE, STRESS, FEMALE 09/18/2006   Qualifier: Diagnosis of  By: Herma Ard    . Mass on back   . MENORRHAGIA 06/27/2009   Qualifier: Diagnosis of  By: Netty Starring  MD, Lucianne Muss    . Migraines   . VENEREAL WART 06/05/2009   Qualifier: Diagnosis of  By: Martinique, Bonnie      Past Surgical History:  Procedure Laterality Date  . BACK SURGERY  2017   L5-S1 spinal fusion  . LIPOMA EXCISION N/A 11/26/2017   Procedure: EXCISION OF BACK LIPOMA;  Surgeon: Ralene Ok, MD;  Location: [facility];  Service: General;  Laterality: N/A;  . TUBAL LIGATION       reports that she has been smoking. She has been smoking about 1.00 pack per day. She has never used smokeless tobacco. She reports that she does not drink alcohol or use drugs.  Allergies  Allergen Reactions  . Flagyl [Metronidazole] Rash    Family History  Problem Relation Age of Onset  . Healthy Mother   . Liver cancer Father   . Healthy Brother   . Healthy Daughter   . Healthy Daughter     Prior to Admission medications   Medication Sig Start Date End Date Taking? Authorizing Provider  acetaminophen (TYLENOL) 500 MG tablet Take 1,000 mg by mouth 3 (three) times daily as needed for moderate pain.   Yes [provider]  aspirin-acetaminophen-caffeine (EXCEDRIN MIGRAINE) [phone] MG per tablet Take 2 tablets by mouth daily as needed for headache or migraine.    Yes [provider]  lisinopril-hydrochlorothiazide (PRINZIDE,ZESTORETIC) 10-12.5 MG tablet Take 1 tablet by mouth daily. 06/20/15  Yes Patrecia Pour, MD  minocycline (MINOCIN,DYNACIN) 100 MG capsule Take 100 mg by mouth 2 (two) times daily.   Yes [provider]  promethazine (PHENERGAN) 25 MG tablet Take 1 tablet (25 mg total) by mouth every 6 (six) hours as needed for nausea or vomiting. 05/14/18  Yes Kathrynn Ducking, MD  rosuvastatin (CRESTOR) 20 MG tablet Take 20 mg by mouth daily.   Yes [provider]  cyclobenzaprine (FLEXERIL) 10 MG tablet Take 1 tablet (10 mg total) by mouth 3 (three) times daily. Patient not taking: Reported on 05/15/2018 04/07/16   Ditty,  Kevan Ny, MD  naratriptan (AMERGE) 2.5 MG tablet Take 1 tablet (2.5 mg total) by mouth 2 (two) times daily as needed for migraine. Patient not taking: Reported on 05/15/2018 05/14/18   Kathrynn Ducking, MD  predniSONE (DELTASONE) 10 MG tablet Begin taking 6 tablets daily, taper by one tablet every other day until off the medication. Patient not taking: Reported on 05/15/2018 05/14/18   Kathrynn Ducking, MD  topiramate (TOPAMAX) 25 MG tablet Take one tablet at night for one week, then take 2 tablets at night for one week, then take 3 tablets at night. Patient not taking: Reported on 05/15/2018 05/14/18   Kathrynn Ducking, MD  traMADol (ULTRAM) 50 MG tablet Take 1 tablet (50 mg total) by mouth every 6 (six) hours as needed. Patient not taking: Reported on 05/15/2018 11/26/17 11/26/18  Ralene Ok, MD    Physical Exam: Vitals:   05/15/18 1745 05/15/18 1815 05/15/18 1830 05/15/18 1945  BP: (!) 149/110 132/82 (!) 142/85 (!) 158/86  Pulse: 75 78 75   Resp: 17 15 16    Temp:      TempSrc:      SpO2: 100% 100% 100%   Weight:      Height:        Physical Exam  Constitutional: She is oriented to person, place, and time.  Lying in the left lateral decubitus position in a hospital stretcher in a dark room with a empty vomit bag next to her.  HENT:  Head: Normocephalic and atraumatic.  Mouth/Throat: Oropharynx is clear and moist.  No nuchal rigidity  Eyes: Pupils are equal, round, and reactive to light. Right eye exhibits no discharge. Left eye exhibits no discharge.  Neck: Neck supple. No tracheal deviation present.  Cardiovascular: Normal rate, regular rhythm and intact distal pulses.  Pulmonary/Chest: Effort normal and breath sounds normal. No respiratory distress. She has no wheezes. She has no rales.  Abdominal: Soft. Bowel sounds are normal. She exhibits no distension. There is no tenderness.  Musculoskeletal: She exhibits no edema.  Neurological: She is alert and oriented to  person, place, and time.  No facial droop Slurring of speech Moving all extremities spontaneously.  Skin: Skin is warm and dry. She is not diaphoretic.  Psychiatric: Her behavior is normal.     Labs on Admission: I have personally reviewed following labs and imaging studies  CBC: Recent Labs  Lab 05/15/18 1653  WBC 12.2*  HGB 16.6*  HCT 49.7*  MCV 93.8  PLT 694   Basic Metabolic Panel: Recent Labs  Lab 05/15/18 1653  NA 137  K 2.9*  CL 107  CO2 19*  GLUCOSE 93  BUN 12  CREATININE 0.86  CALCIUM 9.7   GFR: Estimated Creatinine Clearance: 93.6 mL/min (by C-G formula based on SCr of 0.86 mg/dL). Liver Function Tests: No results for input(s): AST, ALT, ALKPHOS, BILITOT, PROT, ALBUMIN in the last 168 hours. No results for  input(s): LIPASE, AMYLASE in the last 168 hours. No results for input(s): AMMONIA in the last 168 hours. Coagulation Profile: No results for input(s): INR, PROTIME in the last 168 hours. Cardiac Enzymes: No results for input(s): CKTOTAL, CKMB, CKMBINDEX, TROPONINI in the last 168 hours. BNP (last 3 results) No results for input(s): PROBNP in the last 8760 hours. HbA1C: No results for input(s): HGBA1C in the last 72 hours. CBG: No results for input(s): GLUCAP in the last 168 hours. Lipid Profile: No results for input(s): CHOL, HDL, LDLCALC, TRIG, CHOLHDL, LDLDIRECT in the last 72 hours. Thyroid Function Tests: No results for input(s): TSH, T4TOTAL, FREET4, T3FREE, THYROIDAB in the last 72 hours. Anemia Panel: No results for input(s): VITAMINB12, FOLATE, FERRITIN, TIBC, IRON, RETICCTPCT in the last 72 hours. Urine analysis:    Component Value Date/Time   COLORURINE YELLOW 05/15/2018 1812   APPEARANCEUR CLEAR 05/15/2018 1812   LABSPEC 1.017 05/15/2018 1812   PHURINE 5.0 05/15/2018 1812   GLUCOSEU NEGATIVE 05/15/2018 1812   HGBUR SMALL (A) 05/15/2018 1812   HGBUR negative 06/07/2008 1531   BILIRUBINUR NEGATIVE 05/15/2018 1812   KETONESUR 20  (A) 05/15/2018 1812   PROTEINUR NEGATIVE 05/15/2018 1812   UROBILINOGEN 0.2 12/31/2009 1038   NITRITE NEGATIVE 05/15/2018 1812   LEUKOCYTESUR NEGATIVE 05/15/2018 1812    Radiological Exams on Admission: No results found.  EKG: Independently reviewed. Sinus rhythm (HR 86).  Assessment/Plan Principal Problem:   Migraine headache Active Problems:   Hyperlipidemia   Intractable nausea and vomiting   Leukocytosis   Hypokalemia   Elevated hemoglobin (HCC)   HTN (hypertension)   Severe migraine headaches, intractable nausea and vomiting -Presenting with a 8 day history of occipital headaches, intractable nausea and vomiting -Spoke to Dr. Rory Percy (neurology) and he recommended getting a CT head as MRI in [place] was concerning for possible pseudotumor cerebri.  In addition, recommended avoiding anticoagulation for DVT ppx as patient will likely need an LP tomorrow. -CT head without contrast pending  -IV Solu-Medrol orders will be placed by neurology -Dihydroergotamine infusion orders will be placed by neurology -IV Phenergan as needed for nausea -IV fluid hydration  Mild leukocytosis -Likely reactive.  White count 12.2.  Patient is afebrile.  UA not suggestive of infection.  Lungs clear on exam. -Repeat CBC in a.m.  Hypokalemia -Potassium 2.9 in the setting of nausea and vomiting. -Continue to replete -Check magnesium -BMP in a.m.  Elevated hemoglobin -Likely an effect of hemoconcentration in the setting of dehydration from nausea and vomiting.  Hemoglobin 16.6; previously normal. -Repeat CBC in a.m.  Hypertension -Hold home lisinopril-hydrochlorothiazide as patient is at risk for acute kidney injury in the setting of dehydration  Hyperlipidemia -Continue home Crestor  DVT prophylaxis: SCDs Family Communication: Family at bedside updated. Disposition Plan: Anticipate discharge to home after 3 days. Consults called: Neurology (Dr. Rory Percy) Admission status: It is my  clinical opinion that admission to INPATIENT is reasonable and necessary in this 42 y.o. female . presenting with severe migraine headaches and intractable nausea and vomiting . in the context of PMH including: Migraine headaches . with pertinent positives on physical exam including: Photophobia . Workup and treatment include 3-day inpatient treatment with IV Solu-Medrol, dihydroergotamine, IV antiemetics, and IV fluid.  Given the aforementioned, the predictability of an adverse outcome is felt to be significant. I expect that the patient will require at least 2 midnights in the hospital to treat this condition.    Shela Leff MD [facility] Pager [phone]  If  7PM-7AM, please contact night-coverage [email or web address] Password [email or web address]  05/15/2018, 8:11 PM

## 2018-05-15 NOTE — Telephone Encounter (Signed)
Pt boyfriend(on DPR-[place],Gerald) has called, he states pt has had migraine for 8 days even after taking the pill pt threw it up.  He is asking for a call with a suggestion as to what to do

## 2018-05-15 NOTE — ED Triage Notes (Signed)
Pt in c/o migraine x8 days, was in [place] and went to multiple EDs there, went to [facility] yesterday, symptoms just continue with n/v, spoke with her neurologist this am who told patient to come to ED for admission

## 2018-05-15 NOTE — Telephone Encounter (Signed)
I called the patient, talk with the husband.  The patient has been vomiting for 8 days, she is no longer able to keep down food or fluids, she cannot keep down the medications that we gave her for her migraine, she did not respond to IV treatment yesterday.  Given the intractable nausea and vomiting, I have recommended that she go to the emergency room for treatment and will likely need admission for several days with IV fluids, IV antiemetic therapy, consider a 3-day D.H.E. 45 protocol for migraine, and IV Solu-Medrol.  I have asked her to go to [facility] emergency room.  I have called the emergency room, I have let them know that she is coming in for evaluation.

## 2018-05-16 ENCOUNTER — Other Ambulatory Visit: Payer: Self-pay

## 2018-05-16 DIAGNOSIS — E876 Hypokalemia: Secondary | ICD-10-CM

## 2018-05-16 DIAGNOSIS — I10 Essential (primary) hypertension: Secondary | ICD-10-CM

## 2018-05-16 DIAGNOSIS — D72829 Elevated white blood cell count, unspecified: Secondary | ICD-10-CM

## 2018-05-16 LAB — CBC
HEMATOCRIT: 43.8 % (ref 36.0–46.0)
HEMOGLOBIN: 14.6 g/dL (ref 12.0–15.0)
MCH: 31.1 pg (ref 26.0–34.0)
MCHC: 33.3 g/dL (ref 30.0–36.0)
MCV: 93.2 fL (ref 80.0–100.0)
NRBC: 0 % (ref 0.0–0.2)
PLATELETS: 256 10*3/uL (ref 150–400)
RBC: 4.7 MIL/uL (ref 3.87–5.11)
RDW: 12.4 % (ref 11.5–15.5)
WBC: 11.8 10*3/uL — ABNORMAL HIGH (ref 4.0–10.5)

## 2018-05-16 LAB — BASIC METABOLIC PANEL
Anion gap: 9 (ref 5–15)
BUN: 10 mg/dL (ref 6–20)
CHLORIDE: 108 mmol/L (ref 98–111)
CO2: 20 mmol/L — ABNORMAL LOW (ref 22–32)
Calcium: 9.1 mg/dL (ref 8.9–10.3)
Creatinine, Ser: 0.79 mg/dL (ref 0.44–1.00)
GFR calc Af Amer: >60 mL/min (ref >60)
GLUCOSE: 93 mg/dL (ref 70–99)
POTASSIUM: 3.2 mmol/L — AB (ref 3.5–5.1)
Sodium: 137 mmol/L (ref 135–145)

## 2018-05-16 LAB — MAGNESIUM: Magnesium: 1.9 mg/dL (ref 1.7–2.4)

## 2018-05-16 LAB — HIV ANTIBODY (ROUTINE TESTING W REFLEX): HIV Screen 4th Generation wRfx: NONREACTIVE

## 2018-05-16 MED ORDER — ONDANSETRON HCL 4 MG/2ML IJ SOLN
4.0000 mg | Freq: Three times a day (TID) | INTRAMUSCULAR | Status: DC
  Administered 2018-05-16 – 2018-05-17 (×4): 4 mg via INTRAVENOUS
  Filled 2018-05-16 (×5): qty 2

## 2018-05-16 MED ORDER — ONDANSETRON HCL 4 MG/2ML IJ SOLN: 4.0000 mg | Freq: Four times a day (QID) | INTRAMUSCULAR | Status: DC

## 2018-05-16 MED ORDER — DIHYDROERGOTAMINE MESYLATE 1 MG/ML IJ SOLN
0.5000 mg | Freq: Three times a day (TID) | INTRAMUSCULAR | Status: DC
  Administered 2018-05-16 – 2018-05-17 (×3): 0.5 mg via INTRAVENOUS
  Filled 2018-05-16 (×4): qty 0.5

## 2018-05-16 MED ORDER — POTASSIUM CHLORIDE CRYS ER 20 MEQ PO TBCR
40.0000 meq | EXTENDED_RELEASE_TABLET | ORAL | Status: AC
  Administered 2018-05-16 (×2): 40 meq via ORAL
  Filled 2018-05-16 (×2): qty 2

## 2018-05-16 MED ORDER — DIHYDROERGOTAMINE MESYLATE 1 MG/ML IJ SOLN: 0.5000 mg | Freq: Three times a day (TID) | INTRAMUSCULAR | Status: DC

## 2018-05-16 MED ORDER — METOCLOPRAMIDE HCL 5 MG/ML IJ SOLN
10.0000 mg | Freq: Three times a day (TID) | INTRAMUSCULAR | Status: DC
  Administered 2018-05-16 – 2018-05-17 (×4): 10 mg via INTRAVENOUS
  Filled 2018-05-16 (×5): qty 2

## 2018-05-16 NOTE — Progress Notes (Addendum)
NEURO HOSPITALIST PROGRESS NOTE   Subjective: Patient awake, alert in bed, NAD. Room dark and noise kept to a minimum. Patient still photo and phonophobic. Nausea persist. Only vomited when trying to take large potassium pills. Patient states her pain is 8/10 with a goal of 5/10. DHE has helped. Plan to give DHE again today.  Exam: Vitals:   05/16/18 0100 05/16/18 0606  BP: 126/69 139/70  Pulse: 79 75  Resp:    Temp: 98.2 F (36.8 C) 98.4 F (36.9 C)  SpO2: 100% 100%    Physical Exam   HEENT-  Normocephalic, no lesions, without obvious abnormality.  Normal external eye and conjunctiva.   Cardiovascular- S1-S2 audible, pulses palpable throughout   Lungs-no rhonchi or wheezing noted, no excessive working breathing.  Saturations within normal limits on RA Abdomen- All 4 quadrants palpated and nontender Extremities- Warm, dry and intact Musculoskeletal-no joint tenderness, deformity or swelling Skin-warm and dry, no hyperpigmentation, vitiligo, or suspicious lesions   Neuro:  Mental Status: Alert, oriented/ name/age/ event/place/ month/year, thought content appropriate.  Speech fluent without evidence of aphasia.  Able to follow commands without difficulty. Cranial Nerves: II: Visual fields grossly normal,  III,IV, VI: ptosis not present, extra-ocular motions intact bilaterally, V,VII: smile symmetric, facial light touch sensation normal bilaterally VIII: hearing normal bilaterally IX,X: uvula rises symmetrically XI: bilateral shoulder shrug XII: midline tongue extension Motor: Right : Upper extremity   5/5  Left:     Upper extremity   5/5  Lower extremity   5/5  Lower extremity   5/5 Tone and bulk:normal tone throughout; no atrophy noted Sensory: Pinprick and light touch intact throughout, bilaterally Deep Tendon Reflexes: 2+ and symmetric biceps, patella Plantars: Right: downgoing   Left: downgoing Cerebellar: normal finger-to-nose, normal rapid  alternating movements and normal heel-to-shin test Gait: deferred    Medications:  Scheduled: . dihydroergotamine  0.5 mg Intravenous Q8H  . metoCLOPramide (REGLAN) injection  10 mg Intravenous Daily  . potassium chloride  40 mEq Oral Q4H  . rosuvastatin  20 mg Oral Daily   Continuous: . sodium chloride 125 mL/hr at 05/16/18 0055  . methylPREDNISolone (SOLU-MEDROL) injection 250 mg (05/16/18 0844)  . sodium chloride 0.9 % 100 mL with dihydroergotamine (DHE) 0.5 mg infusion 100 mL/hr at 05/16/18 0244   ASN:[id number]  Pertinent Labs/Diagnostics:   Ct Head Wo Contrast  Result Date: 05/15/2018 CLINICAL DATA:  Headache with nausea and vomiting EXAM: CT HEAD WITHOUT CONTRAST TECHNIQUE: Contiguous axial images were obtained from the base of the skull through the vertex without intravenous contrast. COMPARISON:  None. FINDINGS: Brain: Ventricles are normal in size and configuration. There is no intracranial mass, hemorrhage, extra-axial fluid collection, or midline shift. The brain parenchyma appears unremarkable. No evident acute infarct. Vascular: There is no appreciable hyperdense vessel. No vascular calcification evident. Skull: The bony calvarium appears intact. Sinuses/Orbits: There is mucosal thickening and opacification in several ethmoid air cells. There is mucosal thickening with a retention cyst in the anterior right sphenoid sinus. Other visualized paranasal sinuses are clear. Orbits appear symmetric bilaterally. Other: Mastoid air cells are clear. IMPRESSION: Areas of paranasal sinus disease.  Study otherwise unremarkable. Electronically Signed   By: Lowella Grip III M.D.   On: 05/15/2018 20:15   Assessment:  42 -year-old female with intractable headache, nausea and vomiting.  History of migraines and likely presentation is status migrainosus.  Presented  to the ED for IV fluids, antiemetics and DHE protocol.  If she does not respond, will need to re-work her up for secondary  causes for intractable headache such as sinus venous thrombosis. MRI I brain done in [place] did not show any evidence of obvious thrombosis, however dedicated MRV not performed.  Clinically, her presentation less likely is idiopathic intracranial hypertension, but will consider repeat LP not better after DHE. Ct head: no hemorrhage, areas of paranasal sinus disease. Continue DHE to pain goal of 5/10.   Impression: Status migrainosus  Recommendations:  -continue Metaclopromide 10mg  IV q8h -continue Zofran 4mg  IV Q8h - continue DHE protocol  - continue IV fluids at 100 cc/hr  Laurey Morale, MSN, NP-C [facility] [phone]   Attending neurologist's note to follow    05/16/2018, 9:36 AM     NEUROHOSPITALIST ADDENDUM Performed a face to face diagnostic evaluation.   I have reviewed the contents of history and physical exam as documented by PA/ARNP/Resident and agree with above documentation.  I have discussed and formulated the above plan as documented. Edits to the note have been made as needed.  His headache is improved from by 10/10 to 8/10.  She continues to have nausea.  Tolerated the DHE without any complications.  Denies any chest pain weakness in extremity or sensory abnormalities.  We will continue DHE every 8 hours.    Karena Addison Tavious Griesinger MD [facility] [phone]   If 7pm to 7am, please call on call as listed on AMION.

## 2018-05-16 NOTE — Plan of Care (Signed)

## 2018-05-16 NOTE — Progress Notes (Signed)
Patient ID: Kristin Perry, female   DOB: [birth year], 42 y.o.   MRN: [id number]  PROGRESS NOTE    Kristin Perry  JJK:[id number] DOB: Jul 21, 1976 DOA: 05/15/2018 PCP: Donald Prose, MD   Brief Narrative:  42 year old female with history of migraine headaches, asthma, depression, hypertension presented with headaches, nausea and vomiting on 05/15/2018. States she was in [place] week ago and seen in 3 different emergency rooms over there.  An MRI was done and she was told it was normal.  In addition, an LP was done in [place] and patient was told that it was normal.  She returned to [place] 2 days ago and has continued to have symptoms.  She was seen by Dr. Willis/neurology on 05/14/2018 and he did not think her presentation was consistent with pseudotumor cerebri.  Patient was sent to the hospital and was started on intravenous Solu-Medrol and DHE as per neurology.   Assessment & Plan:   Principal Problem:   Migraine headache Active Problems:   Hyperlipidemia   Intractable nausea and vomiting   Leukocytosis   Hypokalemia   Elevated hemoglobin (HCC)   HTN (hypertension)  Status migrainosus -Neurology following.  Currently on intravenous Solu-Medrol and DHE for 3 days. -Headache is only slightly better.  Patient is still nauseous.  Continue pain management and antiemetics. -CT head done on 05/15/2018 was negative for any acute abnormality  Mild leukocytosis -Probably reactive.  Repeat a.m. labs  Hypokalemia -Replace.  Repeat a.m. labs  Hypertension -Home antihypertensive on hold for now.  Monitor  Hyperlipidemia -Continue Crestor   DVT prophylaxis: SCD Code Status: Full Family Communication: Spoke to patient and fianc at bedside Disposition Plan: Home once cleared by neurology  Consultants: Neurology  Procedures: None  Antimicrobials: None   Subjective: Patient seen and examined at bedside.  She feels only slightly better with her headache.  She is  still nauseous.  No overnight fever or vomiting.  Objective: Vitals:   05/15/18 2045 05/15/18 2315 05/16/18 0100 05/16/18 0606  BP: 127/81 132/73 126/69 139/70  Pulse: 85 79 79 75  Resp: (!) 23 12    Temp:   98.2 F (36.8 C) 98.4 F (36.9 C)  TempSrc:   Oral Oral  SpO2: 100% 100% 100% 100%  Weight:      Height:        Intake/Output Summary (Last 24 hours) at 05/16/2018 3716 Last data filed at 05/15/2018 2053 Gross per 24 hour  Intake 1095.12 ml  Output -  Net 1095.12 ml   Filed Weights   05/15/18 1058  Weight: 88.5 kg    Examination:  General exam: Appears calm and comfortable  Respiratory system: Bilateral decreased breath sounds at bases Cardiovascular system: S1 & S2 heard, Rate controlled Gastrointestinal system: Abdomen is nondistended, soft and nontender. Normal bowel sounds heard. Extremities: No cyanosis, clubbing, edema    Data Reviewed: I have personally reviewed following labs and imaging studies  CBC: Recent Labs  Lab 05/15/18 1653 05/16/18 0033  WBC 12.2* 11.8*  HGB 16.6* 14.6  HCT 49.7* 43.8  MCV 93.8 93.2  PLT 289 967   Basic Metabolic Panel: Recent Labs  Lab 05/15/18 1653 05/16/18 0033  NA 137 137  K 2.9* 3.2*  CL 107 108  CO2 19* 20*  GLUCOSE 93 93  BUN 12 10  CREATININE 0.86 0.79  CALCIUM 9.7 9.1  MG  --  1.9   GFR: Estimated Creatinine Clearance: 100.6 mL/min (by C-G formula based on SCr of 0.79 mg/dL). Liver Function  Tests: No results for input(s): AST, ALT, ALKPHOS, BILITOT, PROT, ALBUMIN in the last 168 hours. No results for input(s): LIPASE, AMYLASE in the last 168 hours. No results for input(s): AMMONIA in the last 168 hours. Coagulation Profile: No results for input(s): INR, PROTIME in the last 168 hours. Cardiac Enzymes: No results for input(s): CKTOTAL, CKMB, CKMBINDEX, TROPONINI in the last 168 hours. BNP (last 3 results) No results for input(s): PROBNP in the last 8760 hours. HbA1C: No results for input(s):  HGBA1C in the last 72 hours. CBG: No results for input(s): GLUCAP in the last 168 hours. Lipid Profile: No results for input(s): CHOL, HDL, LDLCALC, TRIG, CHOLHDL, LDLDIRECT in the last 72 hours. Thyroid Function Tests: No results for input(s): TSH, T4TOTAL, FREET4, T3FREE, THYROIDAB in the last 72 hours. Anemia Panel: No results for input(s): VITAMINB12, FOLATE, FERRITIN, TIBC, IRON, RETICCTPCT in the last 72 hours. Sepsis Labs: No results for input(s): PROCALCITON, LATICACIDVEN in the last 168 hours.  No results found for this or any previous visit (from the past 240 hour(s)).       Radiology Studies: Ct Head Wo Contrast  Result Date: 05/15/2018 CLINICAL DATA:  Headache with nausea and vomiting EXAM: CT HEAD WITHOUT CONTRAST TECHNIQUE: Contiguous axial images were obtained from the base of the skull through the vertex without intravenous contrast. COMPARISON:  None. FINDINGS: Brain: Ventricles are normal in size and configuration. There is no intracranial mass, hemorrhage, extra-axial fluid collection, or midline shift. The brain parenchyma appears unremarkable. No evident acute infarct. Vascular: There is no appreciable hyperdense vessel. No vascular calcification evident. Skull: The bony calvarium appears intact. Sinuses/Orbits: There is mucosal thickening and opacification in several ethmoid air cells. There is mucosal thickening with a retention cyst in the anterior right sphenoid sinus. Other visualized paranasal sinuses are clear. Orbits appear symmetric bilaterally. Other: Mastoid air cells are clear. IMPRESSION: Areas of paranasal sinus disease.  Study otherwise unremarkable. Electronically Signed   By: Lowella Grip III M.D.   On: 05/15/2018 20:15        Scheduled Meds: . metoCLOPramide (REGLAN) injection  10 mg Intravenous Daily  . potassium chloride  40 mEq Oral Q4H  . rosuvastatin  20 mg Oral Daily   Continuous Infusions: . sodium chloride 125 mL/hr at 05/16/18  0055  . methylPREDNISolone (SOLU-MEDROL) injection 250 mg (05/16/18 0844)  . sodium chloride 0.9 % 100 mL with dihydroergotamine (DHE) 0.5 mg infusion 100 mL/hr at 05/16/18 0244     LOS: 1 day        Aline August, MD [facility] Pager [phone]  If 7PM-7AM, please contact night-coverage [email or web address] Password [facility] 05/16/2018, 9:27 AM

## 2018-05-17 LAB — BASIC METABOLIC PANEL
Anion gap: 5 (ref 5–15)
BUN: 14 mg/dL (ref 6–20)
CO2: 19 mmol/L — ABNORMAL LOW (ref 22–32)
CREATININE: 0.72 mg/dL (ref 0.44–1.00)
Calcium: 8.6 mg/dL — ABNORMAL LOW (ref 8.9–10.3)
Chloride: 117 mmol/L — ABNORMAL HIGH (ref 98–111)
GFR calc Af Amer: >60 mL/min (ref >60)
GLUCOSE: 103 mg/dL — AB (ref 70–99)
Potassium: 3.4 mmol/L — ABNORMAL LOW (ref 3.5–5.1)
Sodium: 141 mmol/L (ref 135–145)

## 2018-05-17 LAB — MAGNESIUM: Magnesium: 2.2 mg/dL (ref 1.7–2.4)

## 2018-05-17 MED ORDER — DIHYDROERGOTAMINE MESYLATE 1 MG/ML IJ SOLN
0.5000 mg | Freq: Three times a day (TID) | INTRAMUSCULAR | Status: DC
  Filled 2018-05-17 (×4): qty 0.5

## 2018-05-17 MED ORDER — BUPIVACAINE HCL (PF) 0.5 % IJ SOLN
20.0000 mL | Freq: Once | INTRAMUSCULAR | Status: DC
  Filled 2018-05-17 (×2): qty 20

## 2018-05-17 MED ORDER — LIDOCAINE HCL 2 % IJ SOLN
20.0000 mL | Freq: Once | INTRAMUSCULAR | Status: DC
  Filled 2018-05-17: qty 20

## 2018-05-17 MED ORDER — NICOTINE 14 MG/24HR TD PT24
14.0000 mg | MEDICATED_PATCH | Freq: Every day | TRANSDERMAL | Status: DC
  Administered 2018-05-17: 14 mg via TRANSDERMAL
  Filled 2018-05-17 (×3): qty 1

## 2018-05-17 MED ORDER — POTASSIUM CHLORIDE CRYS ER 20 MEQ PO TBCR
40.0000 meq | EXTENDED_RELEASE_TABLET | Freq: Once | ORAL | Status: AC
  Administered 2018-05-17: 40 meq via ORAL
  Filled 2018-05-17: qty 2

## 2018-05-17 MED ORDER — ACETAMINOPHEN 325 MG PO TABS
650.0000 mg | ORAL_TABLET | Freq: Four times a day (QID) | ORAL | Status: DC | PRN
  Administered 2018-05-17 – 2018-05-18 (×4): 650 mg via ORAL
  Filled 2018-05-17 (×4): qty 2

## 2018-05-17 NOTE — Progress Notes (Signed)
Patient ID: Kristin Perry, female   DOB: [birth year], 42 y.o.   MRN: [id number]  PROGRESS NOTE    Kristin Perry  SWN:[id number] DOB: 1976-06-01 DOA: 05/15/2018 PCP: Donald Prose, MD   Brief Narrative:  42 year old female with history of migraine headaches, asthma, depression, hypertension presented with headaches, nausea and vomiting on 05/15/2018. States she was in [place] week ago and seen in 3 different emergency rooms over there.  An MRI was done and she was told it was normal.  In addition, an LP was done in [place] and patient was told that it was normal.  She returned to [place] 2 days ago and has continued to have symptoms.  She was seen by Dr. Willis/neurology on 05/14/2018 and he did not think her presentation was consistent with pseudotumor cerebri.  Patient was sent to the hospital and was started on intravenous Solu-Medrol and DHE as per neurology.   Assessment & Plan:   Principal Problem:   Migraine headache Active Problems:   Hyperlipidemia   Intractable nausea and vomiting   Leukocytosis   Hypokalemia   Elevated hemoglobin (HCC)   HTN (hypertension)  Status migrainosus -Neurology following.  Currently on intravenous Solu-Medrol and DHE for 3 days. -Her headache was worse last night.  Continue pain management and antiemetics. -CT head done on 05/15/2018 was negative for any acute abnormality  Mild leukocytosis -Probably reactive.  Repeat a.m. labs  Hypokalemia -Replace.  Repeat a.m. labs  Hypertension -Home antihypertensive on hold for now.  Blood pressure stable.  Monitor  Hyperlipidemia -Continue Crestor   DVT prophylaxis: SCD Code Status: Full Family Communication: Spoke to patient and fianc at bedside Disposition Plan: Home once cleared by neurology  Consultants: Neurology  Procedures: None  Antimicrobials: None   Subjective: Patient seen and examined at bedside.  Her headache was worse last night.  No current nausea or  vomiting.  Complains of some diarrhea.  Objective: Vitals:   05/16/18 0100 05/16/18 0606 05/16/18 1434 05/16/18 2100  BP: 126/69 139/70 131/60 (!) 118/59  Pulse: 79 75 83 79  Resp:    15  Temp: 98.2 F (36.8 C) 98.4 F (36.9 C) 98 F (36.7 C) 98.3 F (36.8 C)  TempSrc: Oral Oral Oral Oral  SpO2: 100% 100% 98% 100%  Weight:      Height:        Intake/Output Summary (Last 24 hours) at 05/17/2018 1008 Last data filed at 05/17/2018 0559 Gross per 24 hour  Intake 4046.8 ml  Output -  Net 4046.8 ml   Filed Weights   05/15/18 1058  Weight: 88.5 kg    Examination:  General exam: Appears calm and comfortable.  No distress Respiratory system: Bilateral decreased breath sounds at bases, no wheezing Cardiovascular system: S1 & S2 heard, Rate controlled Gastrointestinal system: Abdomen is nondistended, soft and nontender. Normal bowel sounds heard. Extremities: No cyanosis, edema    Data Reviewed: I have personally reviewed following labs and imaging studies  CBC: Recent Labs  Lab 05/15/18 1653 05/16/18 0033  WBC 12.2* 11.8*  HGB 16.6* 14.6  HCT 49.7* 43.8  MCV 93.8 93.2  PLT 289 938   Basic Metabolic Panel: Recent Labs  Lab 05/15/18 1653 05/16/18 0033 05/17/18 0347  NA 137 137 141  K 2.9* 3.2* 3.4*  CL 107 108 117*  CO2 19* 20* 19*  GLUCOSE 93 93 103*  BUN 12 10 14   CREATININE 0.86 0.79 0.72  CALCIUM 9.7 9.1 8.6*  MG  --  1.9 2.2  GFR: Estimated Creatinine Clearance: 100.6 mL/min (by C-G formula based on SCr of 0.72 mg/dL). Liver Function Tests: No results for input(s): AST, ALT, ALKPHOS, BILITOT, PROT, ALBUMIN in the last 168 hours. No results for input(s): LIPASE, AMYLASE in the last 168 hours. No results for input(s): AMMONIA in the last 168 hours. Coagulation Profile: No results for input(s): INR, PROTIME in the last 168 hours. Cardiac Enzymes: No results for input(s): CKTOTAL, CKMB, CKMBINDEX, TROPONINI in the last 168 hours. BNP (last 3  results) No results for input(s): PROBNP in the last 8760 hours. HbA1C: No results for input(s): HGBA1C in the last 72 hours. CBG: No results for input(s): GLUCAP in the last 168 hours. Lipid Profile: No results for input(s): CHOL, HDL, LDLCALC, TRIG, CHOLHDL, LDLDIRECT in the last 72 hours. Thyroid Function Tests: No results for input(s): TSH, T4TOTAL, FREET4, T3FREE, THYROIDAB in the last 72 hours. Anemia Panel: No results for input(s): VITAMINB12, FOLATE, FERRITIN, TIBC, IRON, RETICCTPCT in the last 72 hours. Sepsis Labs: No results for input(s): PROCALCITON, LATICACIDVEN in the last 168 hours.  No results found for this or any previous visit (from the past 240 hour(s)).       Radiology Studies: Ct Head Wo Contrast  Result Date: 05/15/2018 CLINICAL DATA:  Headache with nausea and vomiting EXAM: CT HEAD WITHOUT CONTRAST TECHNIQUE: Contiguous axial images were obtained from the base of the skull through the vertex without intravenous contrast. COMPARISON:  None. FINDINGS: Brain: Ventricles are normal in size and configuration. There is no intracranial mass, hemorrhage, extra-axial fluid collection, or midline shift. The brain parenchyma appears unremarkable. No evident acute infarct. Vascular: There is no appreciable hyperdense vessel. No vascular calcification evident. Skull: The bony calvarium appears intact. Sinuses/Orbits: There is mucosal thickening and opacification in several ethmoid air cells. There is mucosal thickening with a retention cyst in the anterior right sphenoid sinus. Other visualized paranasal sinuses are clear. Orbits appear symmetric bilaterally. Other: Mastoid air cells are clear. IMPRESSION: Areas of paranasal sinus disease.  Study otherwise unremarkable. Electronically Signed   By: Lowella Grip III M.D.   On: 05/15/2018 20:15        Scheduled Meds: . dihydroergotamine  0.5 mg Intravenous Q8H  . metoCLOPramide (REGLAN) injection  10 mg Intravenous Q8H    . ondansetron (ZOFRAN) IV  4 mg Intravenous Q8H  . potassium chloride  40 mEq Oral Once  . rosuvastatin  20 mg Oral Daily   Continuous Infusions: . sodium chloride 100 mL/hr at 05/16/18 1338  . methylPREDNISolone (SOLU-MEDROL) injection Stopped (05/16/18 1747)     LOS: 2 days        Aline August, MD [facility] Pager [phone]  If 7PM-7AM, please contact night-coverage [email or web address] Password [email or web address] 05/17/2018, 10:08 AM

## 2018-05-17 NOTE — Progress Notes (Addendum)
Reason for consult: Status migrainosus  Subjective: She has headache had improved yesterday evening, however patient states that she received DHE dose that was administered quickly and developed a sudden headache shortly after receiving it.  Her headache improved again this morning where it went to 8 /10 after receiving DHE. Discussed patient that we will make sure it is given much slowly.  On reassessing the patient in the afternoon, she had refused her afternoon DHE-as her headache had resolved.  Stated her headache was 1 out of 10.  Only mild complaints of nausea.   ROS: negative except above  Examination  Vital signs in last 24 hours: Temp:  [98 F (36.7 C)] 98 F (36.7 C) (10/27 1419) Pulse Rate:  [83] 83 (10/27 1419) Resp:  [18] 18 (10/27 1419) BP: (131)/(83) 131/83 (10/27 1419) SpO2:  [100 %] 100 % (10/27 1419)  General: lying in bed CVS: pulse-normal rate and rhythm RS: breathing comfortably Extremities: normal   Neuro: MS: Alert, oriented, follows commands CN: pupils equal and reactive,  EOMI, face symmetric, tongue midline, normal sensation over face, Motor: moving all 4 extremities equally Coordination: normal Gait: not tested  Basic Metabolic Panel: Recent Labs  Lab 05/15/18 1653 05/16/18 0033 05/17/18 0347  NA 137 137 141  K 2.9* 3.2* 3.4*  CL 107 108 117*  CO2 19* 20* 19*  GLUCOSE 93 93 103*  BUN 12 10 14   CREATININE 0.86 0.79 0.72  CALCIUM 9.7 9.1 8.6*  MG  --  1.9 2.2    CBC: Recent Labs  Lab 05/15/18 1653 05/16/18 0033  WBC 12.2* 11.8*  HGB 16.6* 14.6  HCT 49.7* 43.8  MCV 93.8 93.2  PLT 289 256     Coagulation Studies: No results for input(s): LABPROT, INR in the last 72 hours.    ASSESSMENT AND PLAN  Status Migrainosus  - has improved remarkably -We will watch overnight, hopefully headache does not return -Continue PRN antiemetics -Has completed her 3 doses of Soumedrol 250mg  -Initially planned on nerve block, however  patient is much improved and therefore will defer the nerve block -Resume Topamax on discharge and follow up with Dr Jannifer Franklin or Dr Jaynee Eagles on discharge.    Karena Addison Aroor [facility] Pager Number [phone] For questions after 7pm please refer to AMION to reach the Neurologist on call

## 2018-05-17 NOTE — Progress Notes (Signed)
Patient's IV infiltrated. Refusing replacement. MD notified.

## 2018-05-18 ENCOUNTER — Telehealth: Payer: Self-pay | Admitting: Neurology

## 2018-05-18 LAB — CBC WITH DIFFERENTIAL/PLATELET
ABS IMMATURE GRANULOCYTES: 0.11 10*3/uL — AB (ref 0.00–0.07)
BASOS PCT: 0 %
Basophils Absolute: 0 10*3/uL (ref 0.0–0.1)
Eosinophils Absolute: 0 10*3/uL (ref 0.0–0.5)
Eosinophils Relative: 0 %
HCT: 40.5 % (ref 36.0–46.0)
Hemoglobin: 13.2 g/dL (ref 12.0–15.0)
IMMATURE GRANULOCYTES: 1 %
Lymphocytes Relative: 26 %
Lymphs Abs: 4.1 10*3/uL — ABNORMAL HIGH (ref 0.7–4.0)
MCH: 31.3 pg (ref 26.0–34.0)
MCHC: 32.6 g/dL (ref 30.0–36.0)
MCV: 96 fL (ref 80.0–100.0)
MONO ABS: 0.8 10*3/uL (ref 0.1–1.0)
Monocytes Relative: 5 %
NEUTROS ABS: 10.5 10*3/uL — AB (ref 1.7–7.7)
NEUTROS PCT: 68 %
PLATELETS: 222 10*3/uL (ref 150–400)
RBC: 4.22 MIL/uL (ref 3.87–5.11)
RDW: 13.1 % (ref 11.5–15.5)
WBC: 15.6 10*3/uL — AB (ref 4.0–10.5)
nRBC: 0 % (ref 0.0–0.2)

## 2018-05-18 LAB — COMPREHENSIVE METABOLIC PANEL
ALT: 28 U/L (ref 0–44)
AST: 20 U/L (ref 15–41)
Albumin: 3.3 g/dL — ABNORMAL LOW (ref 3.5–5.0)
Alkaline Phosphatase: 53 U/L (ref 38–126)
Anion gap: 5 (ref 5–15)
BUN: 9 mg/dL (ref 6–20)
CHLORIDE: 111 mmol/L (ref 98–111)
CO2: 24 mmol/L (ref 22–32)
CREATININE: 0.7 mg/dL (ref 0.44–1.00)
Calcium: 8.9 mg/dL (ref 8.9–10.3)
GFR calc non Af Amer: >60 mL/min (ref >60)
Glucose, Bld: 101 mg/dL — ABNORMAL HIGH (ref 70–99)
Potassium: 3.6 mmol/L (ref 3.5–5.1)
SODIUM: 140 mmol/L (ref 135–145)
Total Bilirubin: 0.3 mg/dL (ref 0.3–1.2)
Total Protein: 5.7 g/dL — ABNORMAL LOW (ref 6.5–8.1)

## 2018-05-18 LAB — MAGNESIUM: Magnesium: 2.1 mg/dL (ref 1.7–2.4)

## 2018-05-18 NOTE — Telephone Encounter (Signed)
Noted/fim 

## 2018-05-18 NOTE — Discharge Summary (Signed)
Physician Discharge Summary  Kristin Perry WEX:[id number] DOB: 02-07-1976 DOA: 05/15/2018  PCP: Donald Prose, MD  Admit date: 05/15/2018 Discharge date: 05/18/2018  Admitted From: Home Disposition: Home  Recommendations for Outpatient Follow-up:  1. Follow up with PCP in 1 week 2. Outpatient follow-up with neurology next and follow-up in the ED if symptoms worsen or new appear   Home Health: No Equipment/Devices: None  Discharge Condition: Stable CODE STATUS: Full Diet recommendation: Heart Healthy  Brief/Interim Summary: 42 year old female with history of migraine headaches, asthma, depression, hypertension presented with headaches, nausea and vomiting on 05/15/2018. States she was in [place] week ago and seen in 3 different emergency rooms over there. An MRI was done and she was told it was normal. In addition, an LP was done in [place] and patient was told that it was normal. She returned to [place] 2 days ago and has continued to have symptoms. She was seen by Dr. Willis/neurology on 05/14/2018 and he did not think her presentation was consistent with pseudotumor cerebri.  Patient was sent to the hospital and was started on intravenous Solu-Medrol and DHE as per neurology.  She is feeling better now and is being discharged.  Discharge Diagnoses:  Principal Problem:   Migraine headache Active Problems:   Hyperlipidemia   Intractable nausea and vomiting   Leukocytosis   Hypokalemia   Elevated hemoglobin (HCC)   HTN (hypertension)  Status migrainosus -Neurology following.  Did with intravenous Solu-Medrol for 3 days along with intravenous DHE -Her headache is much improved.  She will be discharged home to continue her oral home regimen.  Outpatient follow-up with neurology. -CT head done on 05/15/2018 was negative for any acute abnormality  Mild leukocytosis -Probably reactive.   Hypokalemia -Replace.  Improved  Hypertension -Resume home regimen.   Outpatient follow-up  Hyperlipidemia -Continue Crestor  Discharge Instructions  Discharge Instructions    Ambulatory referral to Neurology   Complete by:  As directed    An appointment is requested in approximately: 1 week   Call MD for:  difficulty breathing, headache or visual disturbances   Complete by:  As directed    Call MD for:  extreme fatigue   Complete by:  As directed    Call MD for:  hives   Complete by:  As directed    Call MD for:  persistant dizziness or light-headedness   Complete by:  As directed    Call MD for:  persistant nausea and vomiting   Complete by:  As directed    Call MD for:  severe uncontrolled pain   Complete by:  As directed    Call MD for:  temperature >100.4   Complete by:  As directed    Diet - low sodium heart healthy   Complete by:  As directed    Increase activity slowly   Complete by:  As directed      Allergies as of 05/18/2018      Reactions   Flagyl [metronidazole] Rash      Medication List    STOP taking these medications   cyclobenzaprine 10 MG tablet Commonly known as:  FLEXERIL   traMADol 50 MG tablet Commonly known as:  ULTRAM     TAKE these medications   acetaminophen 500 MG tablet Commonly known as:  TYLENOL Take 1,000 mg by mouth 3 (three) times daily as needed for moderate pain.   aspirin-acetaminophen-caffeine 250-250-65 MG tablet Commonly known as:  EXCEDRIN MIGRAINE Take 2 tablets by mouth daily as  needed for headache or migraine.   lisinopril-hydrochlorothiazide 10-12.5 MG tablet Commonly known as:  PRINZIDE,ZESTORETIC Take 1 tablet by mouth daily.   minocycline 100 MG capsule Commonly known as:  MINOCIN,DYNACIN Take 100 mg by mouth 2 (two) times daily.   naratriptan 2.5 MG tablet Commonly known as:  AMERGE Take 1 tablet (2.5 mg total) by mouth 2 (two) times daily as needed for migraine.   predniSONE 10 MG tablet Commonly known as:  DELTASONE Begin taking 6 tablets daily, taper by one tablet  every other day until off the medication.   promethazine 25 MG tablet Commonly known as:  PHENERGAN Take 1 tablet (25 mg total) by mouth every 6 (six) hours as needed for nausea or vomiting.   rosuvastatin 20 MG tablet Commonly known as:  CRESTOR Take 20 mg by mouth daily.   topiramate 25 MG tablet Commonly known as:  TOPAMAX Take one tablet at night for one week, then take 2 tablets at night for one week, then take 3 tablets at night.       Allergies  Allergen Reactions  . Flagyl [Metronidazole] Rash    Consultations:  Neurology   Procedures/Studies: Ct Head Wo Contrast  Result Date: 05/15/2018 CLINICAL DATA:  Headache with nausea and vomiting EXAM: CT HEAD WITHOUT CONTRAST TECHNIQUE: Contiguous axial images were obtained from the base of the skull through the vertex without intravenous contrast. COMPARISON:  None. FINDINGS: Brain: Ventricles are normal in size and configuration. There is no intracranial mass, hemorrhage, extra-axial fluid collection, or midline shift. The brain parenchyma appears unremarkable. No evident acute infarct. Vascular: There is no appreciable hyperdense vessel. No vascular calcification evident. Skull: The bony calvarium appears intact. Sinuses/Orbits: There is mucosal thickening and opacification in several ethmoid air cells. There is mucosal thickening with a retention cyst in the anterior right sphenoid sinus. Other visualized paranasal sinuses are clear. Orbits appear symmetric bilaterally. Other: Mastoid air cells are clear. IMPRESSION: Areas of paranasal sinus disease.  Study otherwise unremarkable. Electronically Signed   By: Lowella Grip III M.D.   On: 05/15/2018 20:15      Subjective: Patient seen and examined at bedside.  She feels much better this morning.  She wants to go home.  No current headache or nausea.  Discharge Exam: Vitals:   05/18/18 0443 05/18/18 0820  BP: (!) 148/82 137/70  Pulse: 91 80  Resp: 18 16  Temp: 97.8 F  (36.6 C) 97.9 F (36.6 C)  SpO2: 100% 100%   Vitals:   05/17/18 1419 05/17/18 1948 05/18/18 0443 05/18/18 0820  BP: 131/83 140/87 (!) 148/82 137/70  Pulse: 83 79 91 80  Resp: 18 17 18 16   Temp: 98 F (36.7 C) 97.8 F (36.6 C) 97.8 F (36.6 C) 97.9 F (36.6 C)  TempSrc: Oral Oral Oral Oral  SpO2: 100% 98% 100% 100%  Weight:      Height:        General: Pt is alert, awake, not in acute distress Cardiovascular: rate controlled, S1/S2 + Respiratory: bilateral decreased breath sounds at bases Abdominal: Soft, NT, ND, bowel sounds + Extremities: no edema, no cyanosis    The results of significant diagnostics from this hospitalization (including imaging, microbiology, ancillary and laboratory) are listed below for reference.     Microbiology: No results found for this or any previous visit (from the past 240 hour(s)).   Labs: BNP (last 3 results) No results for input(s): BNP in the last 8760 hours. Basic Metabolic Panel: Recent Labs  Lab 05/15/18 1653 05/16/18 0033 05/17/18 0347 05/18/18 0514  NA 137 137 141 140  K 2.9* 3.2* 3.4* 3.6  CL 107 108 117* 111  CO2 19* 20* 19* 24  GLUCOSE 93 93 103* 101*  BUN 12 10 14 9   CREATININE 0.86 0.79 0.72 0.70  CALCIUM 9.7 9.1 8.6* 8.9  MG  --  1.9 2.2 2.1   Liver Function Tests: Recent Labs  Lab 05/18/18 0514  AST 20  ALT 28  ALKPHOS 53  BILITOT 0.3  PROT 5.7*  ALBUMIN 3.3*   No results for input(s): LIPASE, AMYLASE in the last 168 hours. No results for input(s): AMMONIA in the last 168 hours. CBC: Recent Labs  Lab 05/15/18 1653 05/16/18 0033 05/18/18 0514  WBC 12.2* 11.8* 15.6*  NEUTROABS  --   --  10.5*  HGB 16.6* 14.6 13.2  HCT 49.7* 43.8 40.5  MCV 93.8 93.2 96.0  PLT 289 256 222   Cardiac Enzymes: No results for input(s): CKTOTAL, CKMB, CKMBINDEX, TROPONINI in the last 168 hours. BNP: Invalid input(s): POCBNP CBG: No results for input(s): GLUCAP in the last 168 hours. D-Dimer No results for  input(s): DDIMER in the last 72 hours. Hgb A1c No results for input(s): HGBA1C in the last 72 hours. Lipid Profile No results for input(s): CHOL, HDL, LDLCALC, TRIG, CHOLHDL, LDLDIRECT in the last 72 hours. Thyroid function studies No results for input(s): TSH, T4TOTAL, T3FREE, THYROIDAB in the last 72 hours.  Invalid input(s): FREET3 Anemia work up No results for input(s): VITAMINB12, FOLATE, FERRITIN, TIBC, IRON, RETICCTPCT in the last 72 hours. Urinalysis    Component Value Date/Time   COLORURINE YELLOW 05/15/2018 1812   APPEARANCEUR CLEAR 05/15/2018 1812   LABSPEC 1.017 05/15/2018 1812   PHURINE 5.0 05/15/2018 1812   GLUCOSEU NEGATIVE 05/15/2018 1812   HGBUR SMALL (A) 05/15/2018 1812   HGBUR negative 06/07/2008 1531   BILIRUBINUR NEGATIVE 05/15/2018 1812   KETONESUR 20 (A) 05/15/2018 1812   PROTEINUR NEGATIVE 05/15/2018 1812   UROBILINOGEN 0.2 12/31/2009 1038   NITRITE NEGATIVE 05/15/2018 1812   LEUKOCYTESUR NEGATIVE 05/15/2018 1812   Sepsis Labs Invalid input(s): PROCALCITONIN,  WBC,  LACTICIDVEN Microbiology No results found for this or any previous visit (from the past 240 hour(s)).   Time coordinating discharge: 35 minutes  SIGNED:   Aline August, MD  [facility] 05/18/2018, 10:16 AM Pager: [phone]  If 7PM-7AM, please contact night-coverage [email or web address] Password [email or web address]

## 2018-05-18 NOTE — Telephone Encounter (Signed)
Pts fiance returning RNs call, offered appt for 10/31 at 7:30 with Dr. Jannifer Franklin, Pt and finace were greatly appreciated

## 2018-05-18 NOTE — Telephone Encounter (Signed)
Patients fiance called and stated that Dr. Jannifer Franklin sent the patient to ED and she is now being released. The ED wants her to make a follow up with Dr. Jannifer Franklin soon. I offered him the first available but it was Jan. He would like to know if there is anyway for them to come in sooner. Please call and advise.

## 2018-05-18 NOTE — Telephone Encounter (Signed)
Called, LVM for patient's fiance returning his call. Gave [facility] phone number

## 2018-05-18 NOTE — Telephone Encounter (Signed)
Fiance called back, accepted appt. 05/21/18 at 0730/fim

## 2018-05-20 ENCOUNTER — Telehealth: Payer: Self-pay | Admitting: Neurology

## 2018-05-20 MED ORDER — PROMETHAZINE HCL 50 MG RE SUPP: 50.0000 mg | Freq: Four times a day (QID) | RECTAL | 3 refills | Status: DC | PRN

## 2018-05-20 MED ORDER — PROMETHAZINE HCL 25 MG RE SUPP: 25.0000 mg | Freq: Four times a day (QID) | RECTAL | 3 refills | Status: DC | PRN

## 2018-05-20 NOTE — Telephone Encounter (Signed)
Pts husband Berneta Sages called stating that the pt has been vomiting since early this morning, pt is unable to keep down any food or fluids. Requesting a call to discuss if the pt should wait for her appt for tomorrow or be admitted to the hospital

## 2018-05-20 NOTE — Telephone Encounter (Signed)
Called patient to remind her about her appt. today and fiance answered and stated that patient has not stopped throwing up since 3am this morning. He states [facility] doesn't have the correct suppositories that Dr. Jannifer Franklin has recommended. He has requested for a nurse or Dr. Jannifer Franklin to call him back.

## 2018-05-20 NOTE — Telephone Encounter (Signed)
I called the husband.  The patient began having nausea and vomiting again last evening around 2 AM.  They have Phenergan tablets to take but she vomits the tablets back up again.  Following the hospitalization, the headache was reduced to about a 3 out of 10.  The patient was able to go several days without severe vomiting, but now the vomiting is back.  I will call in a prescription for Phenergan suppositories, if she cannot get control of the nausea and vomiting today, she may have to go back to the emergency room.  If the vomiting can be controlled, I will see her tomorrow and consider Marcaine injections for the headache.  The patient is not on minocycline currently, this medication can result in pseudotumor cerebri and promote nausea and vomiting.

## 2018-05-20 NOTE — Telephone Encounter (Signed)
LMTC./fim 

## 2018-05-20 NOTE — Telephone Encounter (Signed)
Kristin Perry called stating [facility] nor other pharmacies have medication Phenergan 50MG  only 25MG . Requesting a call once rx changed

## 2018-05-20 NOTE — Telephone Encounter (Signed)
Duplicate/fim 

## 2018-05-20 NOTE — Telephone Encounter (Signed)
Dr. Jannifer Franklin resent rx. to [facility] in [place].  I spoke with Berneta Sages and made him aware/fim

## 2018-05-20 NOTE — Addendum Note (Signed)
Addended by: Kathrynn Ducking on: 05/20/2018 01:59 PM   Modules accepted: Orders

## 2018-05-20 NOTE — Addendum Note (Signed)
Addended by: Kathrynn Ducking on: 05/20/2018 09:38 AM   Modules accepted: Orders

## 2018-05-20 NOTE — Telephone Encounter (Signed)
I called the patient, they cannot get the 50 mg Phenergan suppositories, I will call in the 25 mg suppositories.

## 2018-05-21 ENCOUNTER — Encounter: Payer: Self-pay | Admitting: Neurology

## 2018-05-21 ENCOUNTER — Other Ambulatory Visit: Payer: Self-pay

## 2018-05-21 ENCOUNTER — Ambulatory Visit: Payer: BLUE CROSS/BLUE SHIELD | Admitting: Neurology

## 2018-05-21 VITALS — BP 102/58 | HR 118 | Resp 16 | Ht 64.0 in | Wt 192.0 lb

## 2018-05-21 DIAGNOSIS — R112 Nausea with vomiting, unspecified: Secondary | ICD-10-CM | POA: Diagnosis not present

## 2018-05-21 DIAGNOSIS — G43709 Chronic migraine without aura, not intractable, without status migrainosus: Secondary | ICD-10-CM

## 2018-05-21 MED ORDER — CHLORPROMAZINE HCL 100 MG PO TABS: 100.0000 mg | ORAL_TABLET | Freq: Two times a day (BID) | ORAL | 1 refills | Status: DC

## 2018-05-21 NOTE — Procedures (Signed)
   History: Kristin Perry is a 42 year old white female with a history of intractable migraine headache.  The patient has recent been in the hospital with severe headaches with a D.H.E. 45 protocol.  The patient has had recurrence of nausea and vomiting and headache.  The patient comes to the office on an urgent basis, Marcaine injections are given to help control the headache.    Bupivicaine injection protocol for headache  Bupivacaine 0.5% was injected on the scalp bilaterally at several locations:  -On the occipital area of the head, 3 injections each side, 1 cc per injection at the midpoint between the mastoid process and the occipital protuberance. 2 other injections were done one finger breadth from the initial injection, one at a 10 o'clock position and the other at a 2 o'clock position.  -2 injections of 0.5 cc were done in the temporal regions, 2 fingerbreadths above the tragus of the ear, with the second injection one fingerbreadth posteriorly to the first.  -3 injections were done on the brow, 1 in the medial brow and 2 over each supraorbital nerve notch, with 0.1 cc for each injection  -1 injection each side of 0.5 cc was done anterior to the tragus of the ear for a trigeminal ganglion injection  The patient tolerated the injections well, no complications of the procedure were noted. Injections were made with a 27-gauge needle.  [place] number is [phone].  Lot number is [id number]. Expriation date is 9/22.

## 2018-05-21 NOTE — Patient Instructions (Signed)
Start Thorazine 100 mg twice a day for 5 days, may extend the dosing to 10 days if needed.  Start the prednisone dose pack.  Use the phenergan suppositories if needed, only if the thorazine is not controlling the vomiting.  Take Topamax if you can.  Ok to take the lorazepam for sleep, OK to take the Tylenol for pain.

## 2018-05-21 NOTE — Progress Notes (Signed)
Reason for visit: Intractable migraine  Kristin Perry is an 42 y.o. female  History of present illness:  Kristin Perry is a 42 year old right-handed white female with a history of intractable migraine headache.  The patient has had intractable nausea and vomiting, but this required a hospitalization on 15 May 2018.  The patient has severe generalized headaches, she has photophobia and phonophobia, has noted that odors are quite bothersome to her.  The vomiting has been unrelenting, it started once again yesterday, the patient finally was able to get Phenergan suppositories which seemed to help the nausea.  The patient has been trying to force fluids, take an soups or broth.  She has lost a lot of weight over the last 2 weeks.  The patient returns to the office today for an evaluation.  With the Phenergan suppositories she has been able to control the nausea and to get some rest last evening.  She claims that the headache is still a 7/10.  The patient confirms that she is not on doxycycline currently.  Past Medical History:  Diagnosis Date  . Anxiety 03/15/2012  . Arthritis   . Asthma   . BOILS, RECURRENT 12/30/2006   Qualifier: Diagnosis of  By: Mellody Drown MD, [facility]    . DEPRESSION, MAJOR, RECURRENT 09/18/2006   Qualifier: Diagnosis of  By: Herma Ard    . Hypertension   . INCONTINENCE, STRESS, FEMALE 09/18/2006   Qualifier: Diagnosis of  By: Herma Ard    . Mass on back   . MENORRHAGIA 06/27/2009   Qualifier: Diagnosis of  By: Netty Starring  MD, Lucianne Muss    . Migraines   . VENEREAL WART 06/05/2009   Qualifier: Diagnosis of  By: Martinique, Bonnie      Past Surgical History:  Procedure Laterality Date  . BACK SURGERY  2017   L5-S1 spinal fusion  . LIPOMA EXCISION N/A 11/26/2017   Procedure: EXCISION OF BACK LIPOMA;  Surgeon: Ralene Ok, MD;  Location: [facility];  Service: General;  Laterality: N/A;  . TUBAL LIGATION      Family History  Problem Relation Age of Onset   . Healthy Mother   . Liver cancer Father   . Healthy Brother   . Healthy Daughter   . Healthy Daughter     Social history:  reports that she has been smoking. She has been smoking about 1.00 pack per day. She has never used smokeless tobacco. She reports that she does not drink alcohol or use drugs.    Allergies  Allergen Reactions  . Flagyl [Metronidazole] Rash    Medications:  Prior to Admission medications   Medication Sig Start Date End Date Taking? Authorizing Provider  acetaminophen (TYLENOL) 500 MG tablet Take 1,000 mg by mouth 3 (three) times daily as needed for moderate pain.   Yes [provider]  aspirin-acetaminophen-caffeine (EXCEDRIN MIGRAINE) [phone] MG per tablet Take 2 tablets by mouth daily as needed for headache or migraine.    Yes [provider]  lisinopril-hydrochlorothiazide (PRINZIDE,ZESTORETIC) 10-12.5 MG tablet Take 1 tablet by mouth daily. 06/20/15  Yes Patrecia Pour, MD  LORazepam (ATIVAN) 0.5 MG tablet TAKE 1 2 TO 1 (ONE HALF TO ONE) TABLET BY MOUTH ONCE DAILY AS NEEDED 05/19/18  Yes [provider]  minocycline (MINOCIN,DYNACIN) 100 MG capsule Take 100 mg by mouth 2 (two) times daily.   Yes [provider]  predniSONE (DELTASONE) 10 MG tablet Begin taking 6 tablets daily, taper by one tablet every other day until  off the medication. 05/14/18  Yes Kathrynn Ducking, MD  promethazine (PHENERGAN) 25 MG suppository Place 1 suppository (25 mg total) rectally every 6 (six) hours as needed for nausea or vomiting. 05/20/18  Yes Kathrynn Ducking, MD  rosuvastatin (CRESTOR) 20 MG tablet Take 20 mg by mouth daily.   Yes [provider]  topiramate (TOPAMAX) 25 MG tablet Take one tablet at night for one week, then take 2 tablets at night for one week, then take 3 tablets at night. 05/14/18  Yes Kathrynn Ducking, MD  chlorproMAZINE (THORAZINE) 100 MG tablet Take 1 tablet (100 mg total) by mouth 2 (two) times daily.  05/21/18   Kathrynn Ducking, MD  naratriptan (AMERGE) 2.5 MG tablet Take 1 tablet (2.5 mg total) by mouth 2 (two) times daily as needed for migraine. Patient not taking: Reported on 05/15/2018 05/14/18   Kathrynn Ducking, MD  promethazine (PHENERGAN) 25 MG tablet Take 1 tablet (25 mg total) by mouth every 6 (six) hours as needed for nausea or vomiting. Patient not taking: Reported on 05/21/2018 05/14/18   Kathrynn Ducking, MD    ROS:  Out of a complete 14 system review of symptoms, the patient complains only of the following symptoms, and all other reviewed systems are negative.  Fevers, chills Fatigue Eye pain Increased thirst Headache Not enough sleep  Blood pressure (!) 102/58, pulse (!) 118, resp. rate 16, height 5\' 4"  (1.626 m), weight 192 lb (87.1 kg), last menstrual period 05/07/2018.  Physical Exam  General: The patient is alert and cooperative at the time of the examination.  Skin: No significant peripheral edema is noted.   Neurologic Exam  Mental status: The patient is alert and oriented x 3 at the time of the examination. The patient has apparent normal recent and remote memory, with an apparently normal attention span and concentration ability.   Cranial nerves: Facial symmetry is present. Speech is normal, no aphasia or dysarthria is noted. Extraocular movements are full. Visual fields are full.  Motor: The patient has good strength in all 4 extremities.  Sensory examination: Soft touch sensation is symmetric on the face, arms, and legs.  Coordination: The patient has good finger-nose-finger and heel-to-shin bilaterally.  Gait and station: The patient has a normal gait. Romberg is negative. No drift is seen.  Reflexes: Deep tendon reflexes are symmetric.   Assessment/Plan:  1.  Intractable migraine headache  2.  Intractable nausea and vomiting  The patient will use the Phenergan suppositories if needed.  I will start a 5-day course of Thorazine  taking 100 mg twice daily, this should help nausea and the headache, and allow her to rest.  She is to continue to force fluids to stay hydrated.  The Thorazine dose can be extended to 10 days if needed.  The patient finds that Tylenol helps her headache, she can continue this.  The Amerge did not help her headache.  The patient will start Topamax if possible, she will start the prednisone Dosepak given to her previously.  She will call for any concerns.  Jill Alexanders MD 05/21/2018 8:00 AM  [facility] [address] [place], [place] [address]  Phone [phone] Fax [phone]

## 2018-06-01 ENCOUNTER — Telehealth: Payer: Self-pay | Admitting: Neurology

## 2018-06-01 NOTE — Telephone Encounter (Signed)
I called the patient.  The patient apparently got good improvement with the Thorazine prescription for 5 days, she did not get the refill because the headache has gone away.  This is appropriate.  The headache does come back, she can always get the refill.

## 2018-06-01 NOTE — Telephone Encounter (Signed)
Spoke with Kristin Perry.  She completed Prednisone dose pk.  She completed Thorazine #10 tablets.  She is currently on Topamax 50mg  qhs, tolerating it well,  and will be increasing to 75mg  qhs on 06/04/18.  H/A is much improved.  Sts. she no longer has a migraine, just milder daily  h/a. (2/10 on pain scale)  She wanted to know if she needed to pick up the Thorazine r/f and take it. I explained if NV has resolved, h/a is much improved, she does not have to take it. She should continue Topamax taper up to 75mg  qhs, prn Tylenol.  I will pass this update along to Dr. Jannifer Franklin and she will get a return call if he wants to do anything differently.  She verbalized understanding of same/fim

## 2018-06-01 NOTE — Telephone Encounter (Signed)
Pt is asking for a call to address questions she has SV:[id number] (THORAZINE) 100 MG tablet

## 2018-08-25 NOTE — Progress Notes (Signed)
[facility]  PATIENT: Kristin Perry DOB: [birth year]   REASON FOR VISIT: Follow-up for migraine, new complaint of daytime somnolence HISTORY FROM: Patient and husband    HISTORY OF PRESENT ILLNESS:UPDATE 2/5//2020CM Kristin Perry, 43 year old female returns for follow-up with history of migraine headaches.  When last seen her headaches were intractable and she was given Thorazine to take 100 mg twice daily for 5 days.  Her headaches stopped after that and has not recurred.  She is currently taking Topamax 75 mg at night.  She has a new complaint today of daytime somnolence her husband says she snores at night and quits breathing.  She wakes up with headaches some mornings.  She does not feel well rested when she awakens after sleeping all night.  She has never had a sleep study.  She is overweight.  ESS: 15.  She returns for reevaluation 05/21/18 KW Kristin Perry is a 43 year old right-handed white female with a history of intractable migraine headache.  The patient has had intractable nausea and vomiting, but this required a hospitalization on 15 May 2018.  The patient has severe generalized headaches, she has photophobia and phonophobia, has noted that odors are quite bothersome to her.  The vomiting has been unrelenting, it started once again yesterday, the patient finally was able to get Phenergan suppositories which seemed to help the nausea.  The patient has been trying to force fluids, take an soups or broth.  She has lost a lot of weight over the last 2 weeks.  The patient returns to the office today for an evaluation.  With the Phenergan suppositories she has been able to control the nausea and to get some rest last evening.  She claims that the headache is still a 7/10.  The patient confirms that she is not on doxycycline currently.   REVIEW OF SYSTEMS: Full 14 system review of systems performed and notable only for those listed, all others are neg:    Constitutional: neg  Cardiovascular: neg Ear/Nose/Throat: neg  Skin: neg Eyes: neg Respiratory: neg Gastroitestinal: neg  Hematology/Lymphatic: neg  Endocrine: neg Musculoskeletal:neg Allergy/Immunology: neg Neurological: Headache Psychiatric: neg Sleep : Daytime drowsiness, witnessed apnea   ALLERGIES: Allergies  Allergen Reactions  . Flagyl [Metronidazole] Rash    HOME MEDICATIONS: Outpatient Medications Prior to Visit  Medication Sig Dispense Refill  . acetaminophen (TYLENOL) 500 MG tablet Take 1,000 mg by mouth 3 (three) times daily as needed for moderate pain.    Marland Kitchen aspirin-acetaminophen-caffeine (EXCEDRIN MIGRAINE) 250-250-65 MG per tablet Take 2 tablets by mouth daily as needed for headache or migraine.     . chlorproMAZINE (THORAZINE) 100 MG tablet Take 1 tablet (100 mg total) by mouth 2 (two) times daily. 10 tablet 1  . lisinopril-hydrochlorothiazide (PRINZIDE,ZESTORETIC) 10-12.5 MG tablet Take 1 tablet by mouth daily. 90 tablet 3  . minocycline (MINOCIN,DYNACIN) 100 MG capsule Take 100 mg by mouth 2 (two) times daily.    . rosuvastatin (CRESTOR) 20 MG tablet Take 20 mg by mouth daily.    Marland Kitchen topiramate (TOPAMAX) 25 MG tablet Take one tablet at night for one week, then take 2 tablets at night for one week, then take 3 tablets at night. 90 tablet 3  . LORazepam (ATIVAN) 0.5 MG tablet TAKE 1 2 TO 1 (ONE HALF TO ONE) TABLET BY MOUTH ONCE DAILY AS NEEDED  0  . naratriptan (AMERGE) 2.5 MG tablet Take 1 tablet (2.5 mg total) by mouth 2 (two) times daily as needed for migraine. (Patient  not taking: Reported on 05/15/2018) 10 tablet 3  . promethazine (PHENERGAN) 25 MG suppository Place 1 suppository (25 mg total) rectally every 6 (six) hours as needed for nausea or vomiting. (Patient not taking: Reported on 08/26/2018) 12 each 3  . promethazine (PHENERGAN) 25 MG tablet Take 1 tablet (25 mg total) by mouth every 6 (six) hours as needed for nausea or vomiting. (Patient not taking:  Reported on 05/21/2018) 30 tablet 3  . predniSONE (DELTASONE) 10 MG tablet Begin taking 6 tablets daily, taper by one tablet every other day until off the medication. (Patient not taking: Reported on 08/26/2018) 42 tablet 0   No facility-administered medications prior to visit.     PAST MEDICAL HISTORY: Past Medical History:  Diagnosis Date  . Anxiety 03/15/2012  . Arthritis   . Asthma   . BOILS, RECURRENT 12/30/2006   Qualifier: Diagnosis of  By: Mellody Drown MD, [facility]    . DEPRESSION, MAJOR, RECURRENT 09/18/2006   Qualifier: Diagnosis of  By: Herma Ard    . Hypertension   . INCONTINENCE, STRESS, FEMALE 09/18/2006   Qualifier: Diagnosis of  By: Herma Ard    . Mass on back   . MENORRHAGIA 06/27/2009   Qualifier: Diagnosis of  By: Netty Starring  MD, Lucianne Muss    . Migraines   . VENEREAL WART 06/05/2009   Qualifier: Diagnosis of  By: Martinique, Bonnie      PAST SURGICAL HISTORY: Past Surgical History:  Procedure Laterality Date  . BACK SURGERY  2017   L5-S1 spinal fusion  . LIPOMA EXCISION N/A 11/26/2017   Procedure: EXCISION OF BACK LIPOMA;  Surgeon: Ralene Ok, MD;  Location: [facility];  Service: General;  Laterality: N/A;  . TUBAL LIGATION      FAMILY HISTORY: Family History  Problem Relation Age of Onset  . Healthy Mother   . Liver cancer Father   . Healthy Brother   . Healthy Daughter   . Healthy Daughter     SOCIAL HISTORY: Social History   Socioeconomic History  . Marital status: Single    Spouse name: Not on file  . Number of children: Not on file  . Years of education: Not on file  . Highest education level: Not on file  Occupational History  . Not on file  Social Needs  . Financial resource strain: Not on file  . Food insecurity:    Worry: Not on file    Inability: Not on file  . Transportation needs:    Medical: Not on file    Non-medical: Not on file  Tobacco Use  . Smoking status: Current Every Day Smoker    Packs/day: 1.00  . Smokeless  tobacco: Never Used  Substance and Sexual Activity  . Alcohol use: No  . Drug use: No  . Sexual activity: Yes    Birth control/protection: Surgical  Lifestyle  . Physical activity:    Days per week: Not on file    Minutes per session: Not on file  . Stress: Not on file  Relationships  . Social connections:    Talks on phone: Not on file    Gets together: Not on file    Attends religious service: Not on file    Active member of club or organization: Not on file    Attends meetings of clubs or organizations: Not on file    Relationship status: Not on file  . Intimate partner violence:    Fear of current or ex partner: Not on file  Emotionally abused: Not on file    Physically abused: Not on file    Forced sexual activity: Not on file  Other Topics Concern  . Not on file  Social History Narrative  . Not on file     PHYSICAL EXAM  Vitals:   08/26/18 1324  BP: 100/65  Pulse: 74  Weight: 195 lb 9.6 oz (88.7 kg)  Height: 5\' 4"  (1.626 m)   Body mass index is 33.57 kg/m.  Generalized: Well developed, obese female in no acute distress  Head: normocephalic and atraumatic,. Oropharynx benign  Neck: Supple,  Musculoskeletal: No deformity   Neurological examination   Mentation: Alert oriented to time, place, history taking. Attention span and concentration appropriate. Recent and remote memory intact.  Follows all commands speech and language fluent. ESS 15  Cranial nerve II-XII: Pupils were equal round reactive to light extraocular movements were full, visual field were full on confrontational test. Facial sensation and strength were normal. hearing was intact to finger rubbing bilaterally. Uvula tongue midline. head turning and shoulder shrug were normal and symmetric.Tongue protrusion into cheek strength was normal. Motor: normal bulk and tone, full strength in the BUE, BLE,  Sensory: normal and symmetric to light touch, pinprick, and  Vibration, in the upper and lower  extremities Coordination: finger-nose-finger, heel-to-shin bilaterally, no dysmetria Reflexes: Brachioradialis 2/2, biceps 2/2, triceps 2/2, patellar 2/2, Achilles 2/2, plantar responses were flexor bilaterally. Gait and Station: Rising up from seated position without assistance, normal stance,  moderate stride, good arm swing, smooth turning, able to perform tiptoe, and heel walking without difficulty. Tandem gait is steady  DIAGNOSTIC DATA (LABS, IMAGING, TESTING) - I reviewed patient records, labs, notes, testing and imaging myself where available.  Lab Results  Component Value Date   WBC 15.6 (H) 05/18/2018   HGB 13.2 05/18/2018   HCT 40.5 05/18/2018   MCV 96.0 05/18/2018   PLT 222 05/18/2018      Component Value Date/Time   NA 140 05/18/2018 0514   K 3.6 05/18/2018 0514   CL 111 05/18/2018 0514   CO2 24 05/18/2018 0514   GLUCOSE 101 (H) 05/18/2018 0514   BUN 9 05/18/2018 0514   CREATININE 0.70 05/18/2018 0514   CREATININE 0.70 02/21/2015 1555   CALCIUM 8.9 05/18/2018 0514   PROT 5.7 (L) 05/18/2018 0514   ALBUMIN 3.3 (L) 05/18/2018 0514   ALBUMIN 4.1 11/27/2011 1440   AST 20 05/18/2018 0514   AST 26 11/27/2011 1440   ALT 28 05/18/2018 0514   ALKPHOS 53 05/18/2018 0514   BILITOT 0.3 05/18/2018 0514   BILITOT 0.3 11/27/2011 1440   GFRNONAA >60 05/18/2018 0514   GFRAA >60 05/18/2018 0514   Lab Results  Component Value Date   CHOL 214 (H) 02/21/2015   HDL 28 (L) 02/21/2015   LDLCALC 151 (H) 02/21/2015   TRIG 175 (H) 02/21/2015   CHOLHDL 7.6 (H) 02/21/2015    Lab Results  Component Value Date   TSH 1.650 01/29/2012      ASSESSMENT AND PLAN  43 y.o. year old female here to follow-up for migraine headaches and new complaint of daytime drowsiness snoring and witnessed apnea by husband during sleep    Continue Topamax 3 tablets at night will refill Will order sleep study for excessive daytime drowsiness ESS 14 She will follow-up after sleep study I  explained in particular the risks and ramifications of untreated moderate to severe OSA, especially with respect to cardiovascular disease  including congestive heart failure, difficult to  treat hypertension, cardiac arrhythmias, or stroke. Even type 2 diabetes has, in part, been linked to untreated OSA. Symptoms of untreated OSA include daytime sleepiness, memory problems, mood irritability and mood disorder such as depression and anxiety, lack of energy, as well as recurrent headaches, especially morning headaches. We talked about trying to maintain a healthy lifestyle in general, as well as the importance of weight control. I encouraged the patient to eat healthy, exercise daily and keep well hydrated, to keep a scheduled bedtime and wake time routine, to not skip any meals and eat healthy snacks in between meals I spent 57min  in total face to face time with the patient more than 50% of which was spent counseling and coordination of care, reviewing test results reviewing medications and discussing and reviewing the diagnosis of migraine and untreated sleep apnea and further treatment options.  If headaches worsen patient is to keep a record, Dennie Bible, [facility], [facility], APRN  [facility] [address], [place], [place] [address] [phone]

## 2018-08-26 ENCOUNTER — Ambulatory Visit: Payer: BLUE CROSS/BLUE SHIELD | Admitting: Nurse Practitioner

## 2018-08-26 ENCOUNTER — Encounter: Payer: Self-pay | Admitting: Nurse Practitioner

## 2018-08-26 VITALS — BP 100/65 | HR 74 | Ht 64.0 in | Wt 195.6 lb

## 2018-08-26 DIAGNOSIS — G43709 Chronic migraine without aura, not intractable, without status migrainosus: Secondary | ICD-10-CM | POA: Diagnosis not present

## 2018-08-26 DIAGNOSIS — R400 Somnolence: Secondary | ICD-10-CM | POA: Insufficient documentation

## 2018-08-26 MED ORDER — TOPIRAMATE 25 MG PO TABS: ORAL_TABLET | ORAL | 6 refills | Status: DC

## 2018-08-26 NOTE — Patient Instructions (Signed)
Continue Topamax 3 tablets at night Will order sleep study for excessive daytime drowsiness ESS 14 She will follow-up after sleep study I explained in particular the risks and ramifications of untreated moderate to severe OSA, especially with respect to cardiovascular disease  including congestive heart failure, difficult to treat hypertension, cardiac arrhythmias, or stroke. Even type 2 diabetes has, in part, been linked to untreated OSA. Symptoms of untreated OSA include daytime sleepiness, memory problems, mood irritability and mood disorder such as depression and anxiety, lack of energy, as well as recurrent headaches, especially morning headaches. We talked about trying to maintain a healthy lifestyle in general, as well as the importance of weight control. I encouraged the patient to eat healthy, exercise daily and keep well hydrated, to keep a scheduled bedtime and wake time routine, to not skip any meals and eat healthy snacks in between meals

## 2018-08-26 NOTE — Progress Notes (Signed)
I have read the note, and I agree with the clinical assessment and plan.  Delane Stalling K Shamarr Faucett   

## 2018-09-23 IMAGING — US US PELVIS COMPLETE TRANSABD/TRANSVAG
1 series · 13 of 25 positions shown · non-contrast
Comparison: Prior CT from 09/17/2017

CLINICAL DATA: Initial evaluation for right ovarian cyst



[Series 1: us pelvis complete transabd/transvag · 0.28mm/px · 13 of 69 slices shown]
[im 1/69]
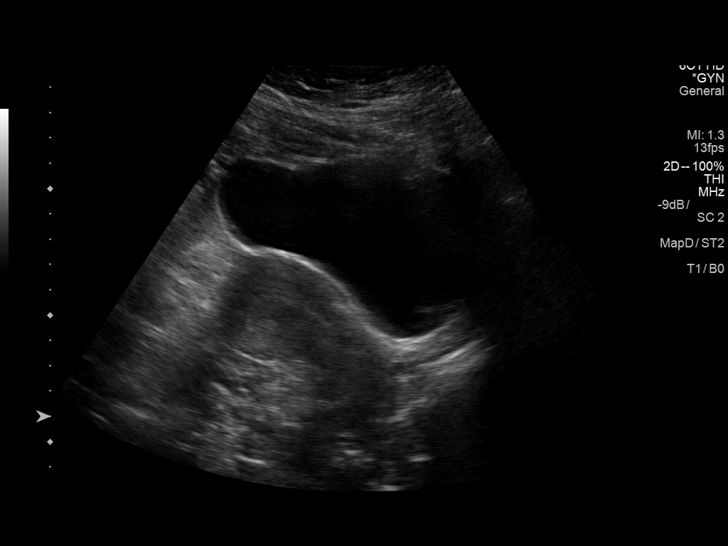
[im 6/69]
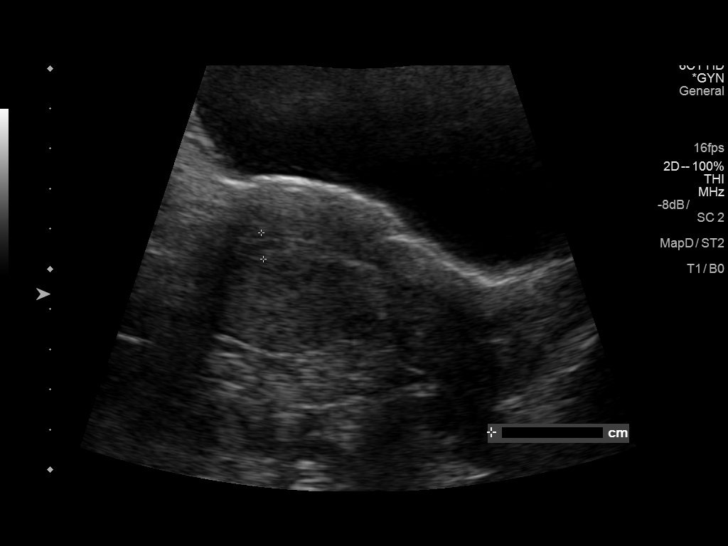
[im 12/69]
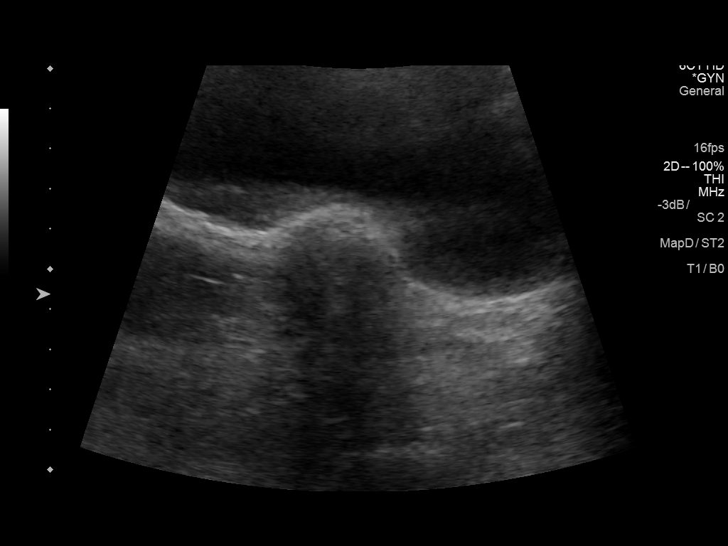
[im 18/69]
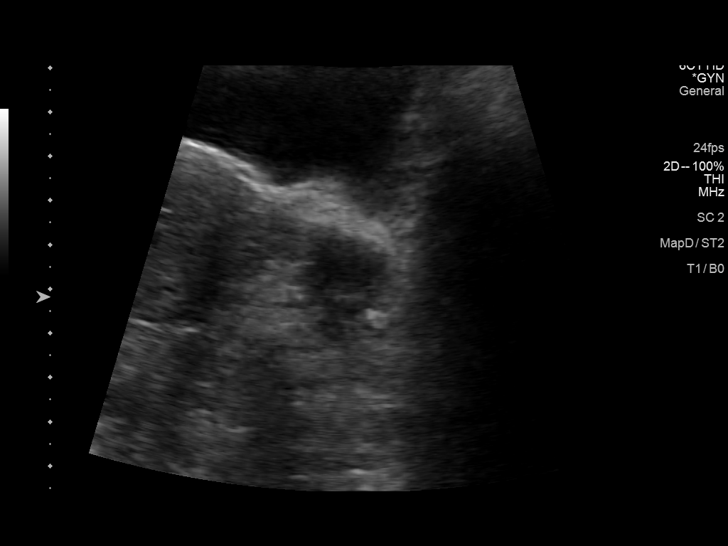
[im 23/69]
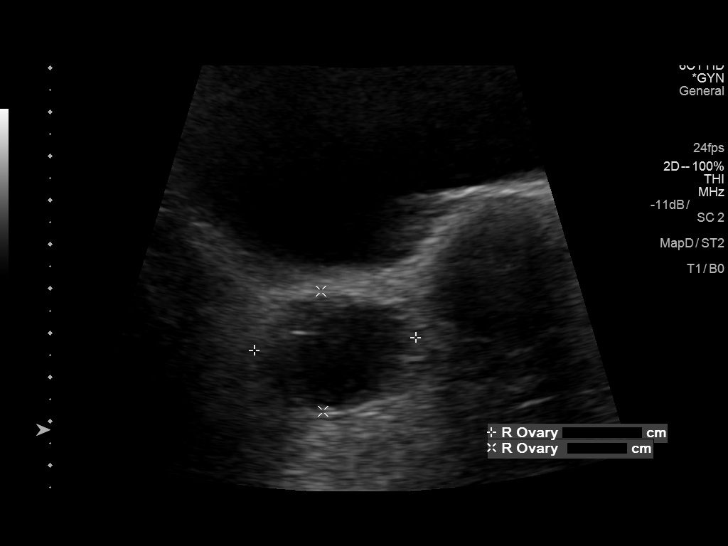
[im 29/69]
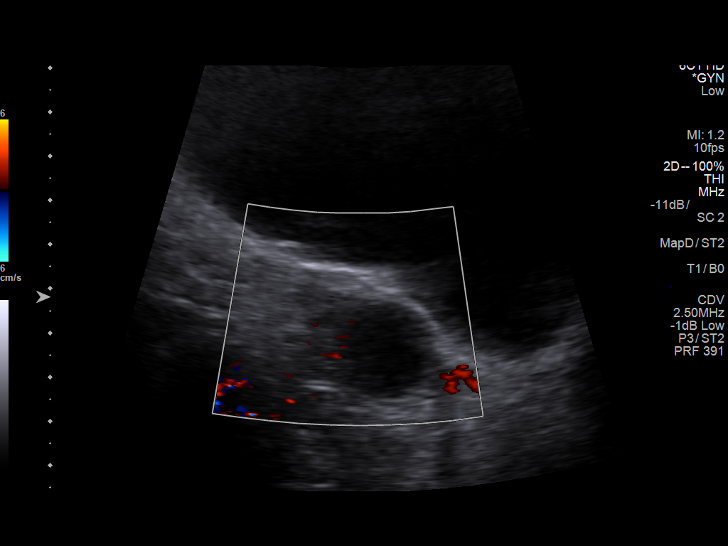
[im 35/69]
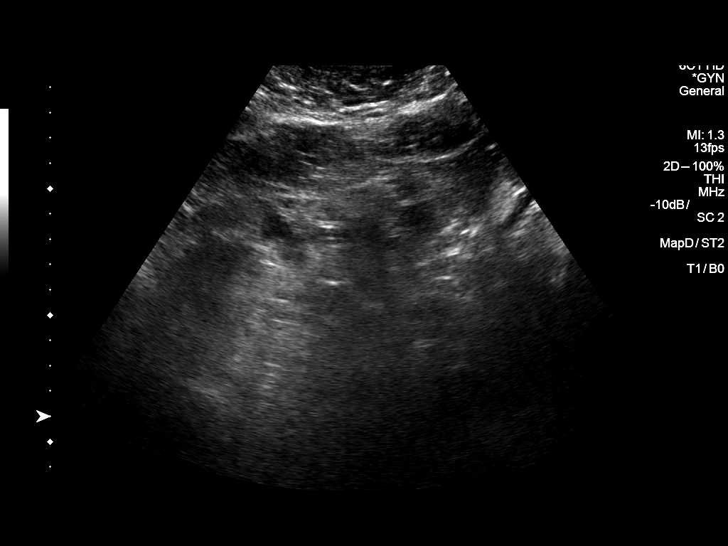
[im 40/69]
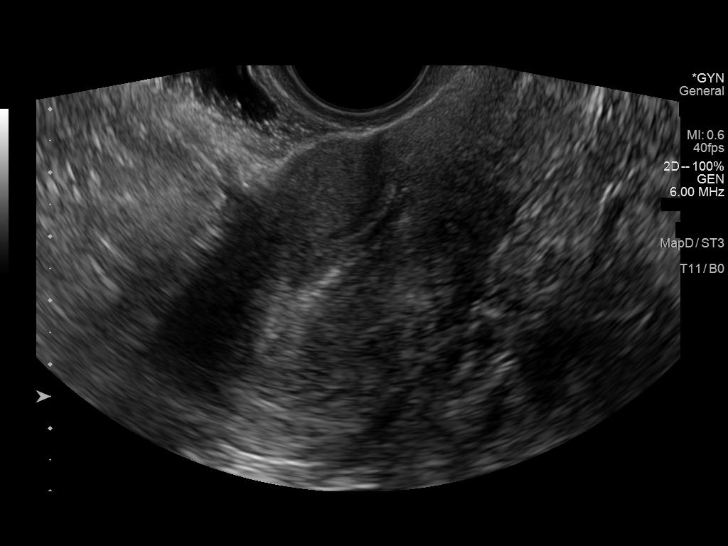
[im 46/69]
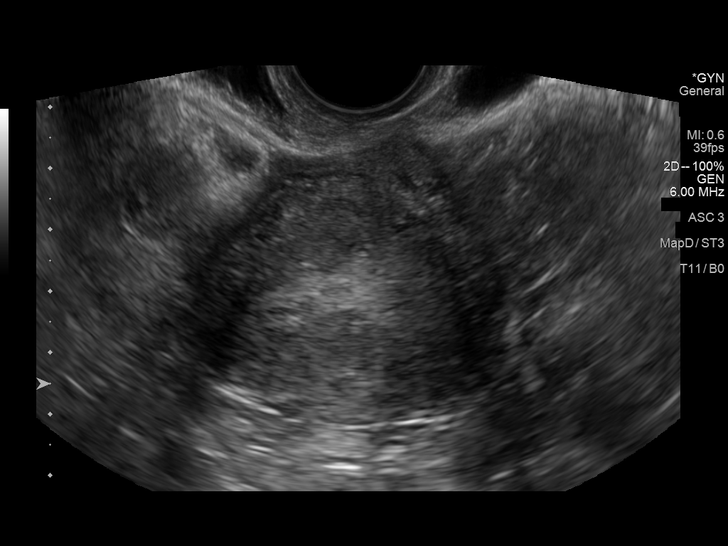
[im 52/69]
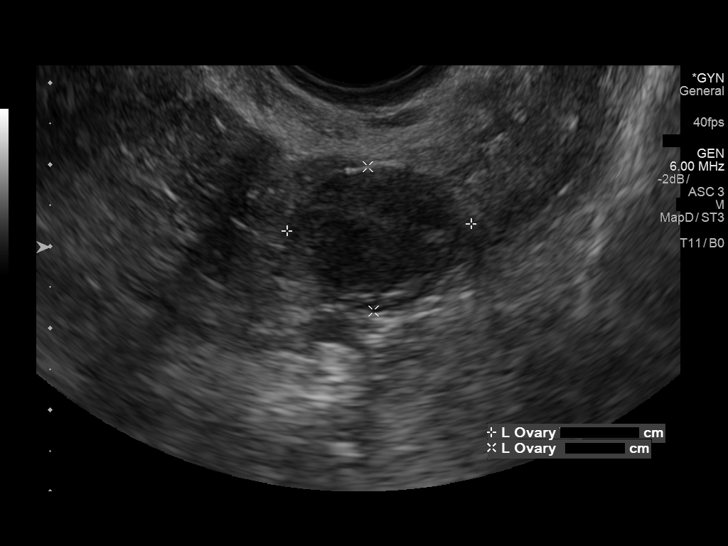
[im 57/69]
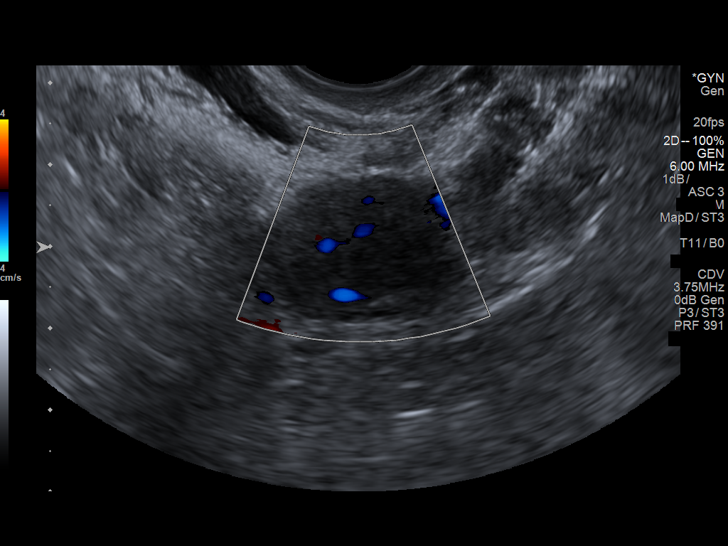
[im 63/69]
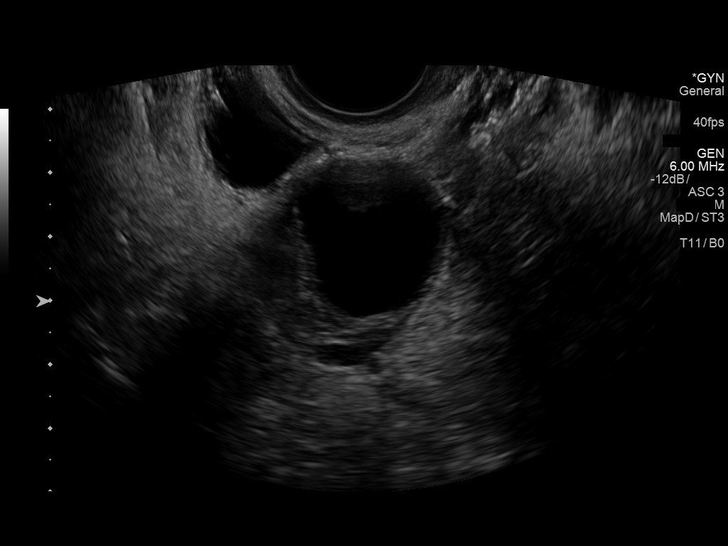
[im 69/69]
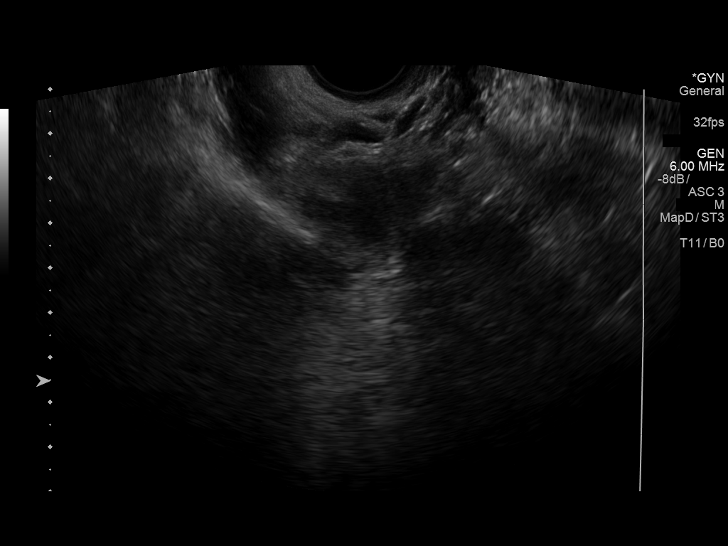

[13 of 25 positions shown; findings below may reference images not displayed]

FINDINGS: Uterus

Measurements: 9.0 x 4.1 x 4.6 cm. No fibroids or other mass
visualized.

Endometrium

Thickness: 9.5 mm.  No focal abnormality visualized.

Right ovary

Measurements: 3.2 x 3.9 x 3.5 cm. 2.0 x 2.5 x 2.3 cm simple cyst. No
significant internal complexity or vascularity.

Left ovary

Measurements: 2.9 x 1.7 x 2.3 cm. Normal appearance/no adnexal mass.

Other findings

No abnormal free fluid.
IMPRESSION: 1. 2.5 cm simple right ovarian cyst, most likely a normal
physiologic cyst. Given size and appearance, no follow-up imaging is
recommended regarding this lesion.
2. Otherwise unremarkable pelvic ultrasound.

## 2018-10-06 ENCOUNTER — Institutional Professional Consult (permissible substitution): Payer: BLUE CROSS/BLUE SHIELD | Admitting: Neurology

## 2018-10-29 ENCOUNTER — Telehealth: Payer: Self-pay

## 2018-10-29 NOTE — Telephone Encounter (Signed)
Due to current COVID 19 pandemic, our office is severely reducing in office visits for at least the next 2 weeks, in order to minimize the risk to our patients and healthcare providers.   I called pt, offered her a sooner virtual visit with Dr. Rexene Alberts. Pt is agreeable to this and a virtual visit was scheduled for 11/04/18 at 1:30pm.  Pt understands that although there may be some limitations with this type of visit, we will take all precautions to reduce any security or privacy concerns.  Pt understands that this will be treated like an in office visit and we will file with pt's insurance, and there may be a patient responsible charge related to this service.  Pt's email is [email or web address]. Pt understands that the [employer] webex software must be downloaded and operational on the device pt plans to use for the visit.  Pt has never had a sleep study but does endorse snoring.  Pt's recent weight is 195 lbs and she is 5'4.  Pt was instructed on how to measure her neck size prior to her appt. Pt reports that she has been told that her neck size is 19.5''.  Epworth Sleepiness Scale 0= would never doze 1= slight chance of dozing 2= moderate chance of dozing 3= high chance of dozing  Sitting and reading: 3 Watching TV: 2 Sitting inactive in a public place (ex. Theater or meeting): 3 As a passenger in a car for an hour without a break: 0 Lying down to rest in the afternoon: 0 Sitting and talking to someone: 0 Sitting quietly after lunch (no alcohol): 1 In a car, while stopped in traffic: 0 Total: 9  FSS: 49

## 2018-11-04 ENCOUNTER — Encounter: Payer: Self-pay | Admitting: Neurology

## 2018-11-04 ENCOUNTER — Ambulatory Visit (INDEPENDENT_AMBULATORY_CARE_PROVIDER_SITE_OTHER): Payer: BLUE CROSS/BLUE SHIELD | Admitting: Neurology

## 2018-11-04 ENCOUNTER — Other Ambulatory Visit: Payer: Self-pay

## 2018-11-04 DIAGNOSIS — E669 Obesity, unspecified: Secondary | ICD-10-CM | POA: Diagnosis not present

## 2018-11-04 DIAGNOSIS — F17200 Nicotine dependence, unspecified, uncomplicated: Secondary | ICD-10-CM

## 2018-11-04 DIAGNOSIS — R0681 Apnea, not elsewhere classified: Secondary | ICD-10-CM

## 2018-11-04 DIAGNOSIS — R0683 Snoring: Secondary | ICD-10-CM

## 2018-11-04 DIAGNOSIS — G4719 Other hypersomnia: Secondary | ICD-10-CM | POA: Diagnosis not present

## 2018-11-04 NOTE — Patient Instructions (Signed)
Given verbally, during today's virtual video-based encounter, with verbal feedback received.   

## 2018-11-04 NOTE — Progress Notes (Signed)
[name], MD, PhD [facility] [address], [address], [place] [address]   Virtual Visit via Video Note on 11/04/2018:  I connected with [name] on 11/04/18 at  1:30 PM EDT by a video enabled telemedicine application and verified that I am speaking with the correct person using two identifiers.   I discussed the limitations of evaluation and management by telemedicine and the availability of in person appointments. The patient expressed understanding and agreed to proceed.  History of Present Illness: Ms. Kristin Perry is a 43 year old right-handed woman with an underlying medical history of migraines, hypertension, depression, anxiety, arthritis, asthma and obesity, with whom I am conducting a virtual, video based new patient visit via Webex in lieu of a face-to-face visit for evaluation of her sleep disorder, in particular, concern for underlying obstructive sleep apnea. The patient is unaccompanied today and joins via cell phone from home. She missed an appointment on 10/06/2018. She is referred by Dr. Jannifer Franklin and NP, Cecille Rubin, and I reviewed her note from 08/26/18. Patient endorses snoring, AM HAs, and daytime somnolence as well as witnessed apneas.  Her Epworth sleepiness score is 9 out of 24, fatigue severity score is 49 out of 63.  She lives with her fianc and her daughter. She snores loudly at times. She has woken herself up with her snoring and gasping sensation. She is a very deep sleeper and is difficult to wake up. She has been like this since her teenage years. She denies any significant daytime somnolence. Bedtime is around 10 and she falls asleep without difficulty. Rise time around 6:30. She has no night to night nocturia and no morning headaches recently, her Topamax has been helpful. She sleeps with 3 large dogs in the bedroom, dogs her on the floor. The TV in the bedroom is turned off before she falls asleep. She drinks caffeine in  the form of coffee, about 4 cups per day and soda, about 2 cans per day on average, smokes about 1 pack per day. She has tried to quit in the past but has not been successful. She drinks no alcohol currently. She is not aware of any family history of OSA, never had a tonsillectomy.   Her Past Medical History Is Significant For: Past Medical History:  Diagnosis Date   Anxiety 03/15/2012   Arthritis    Asthma    BOILS, RECURRENT 12/30/2006   Qualifier: Diagnosis of  By: Mellody Drown MD, [facility]     DEPRESSION, MAJOR, RECURRENT 09/18/2006   Qualifier: Diagnosis of  By: Herma Ard     Hypertension    INCONTINENCE, STRESS, FEMALE 09/18/2006   Qualifier: Diagnosis of  By: Herma Ard     Mass on back    MENORRHAGIA 06/27/2009   Qualifier: Diagnosis of  By: Netty Starring  MD, [facility]    VENEREAL WART 06/05/2009   Qualifier: Diagnosis of  By: [place], Bonnie      Her Past Surgical History Is Significant For: Past Surgical History:  Procedure Laterality Date   BACK SURGERY  2017   L5-S1 spinal fusion   LIPOMA EXCISION N/A 11/26/2017   Procedure: EXCISION OF BACK LIPOMA;  Surgeon: Ralene Ok, MD;  Location: [place];  Service: General;  Laterality: N/A;   TUBAL LIGATION      Her Family History Is Significant For: Family History  Problem Relation Age of Onset   Healthy Mother    Liver cancer Father  Healthy Brother    Healthy Daughter    Healthy Daughter     Her Social History Is Significant For: Social History   Socioeconomic History   Marital status: Single    Spouse name: Not on file   Number of children: Not on file   Years of education: Not on file   Highest education level: Not on file  Occupational History   Not on file  Social Needs   Financial resource strain: Not on file   Food insecurity:    Worry: Not on file    Inability: Not on file   Transportation needs:    Medical: Not on file    Non-medical: Not on file    Tobacco Use   Smoking status: Current Every Day Smoker    Packs/day: 1.00   Smokeless tobacco: Never Used  Substance and Sexual Activity   Alcohol use: No   Drug use: No   Sexual activity: Yes    Birth control/protection: Surgical  Lifestyle   Physical activity:    Days per week: Not on file    Minutes per session: Not on file   Stress: Not on file  Relationships   Social connections:    Talks on phone: Not on file    Gets together: Not on file    Attends religious service: Not on file    Active member of club or organization: Not on file    Attends meetings of clubs or organizations: Not on file    Relationship status: Not on file  Other Topics Concern   Not on file  Social History Narrative   Not on file    Her Allergies Are:  Allergies  Allergen Reactions   Amoxicillin-Pot Clavulanate Nausea And Vomiting  :   Her Current Medications Are:  Outpatient Encounter Medications as of 11/04/2018  Medication Sig   lisinopril-hydrochlorothiazide (PRINZIDE,ZESTORETIC) 10-12.5 MG tablet Take 1 tablet by mouth daily.   rosuvastatin (CRESTOR) 20 MG tablet Take 20 mg by mouth daily.   topiramate (TOPAMAX) 25 MG tablet 3 tablets at night.   No facility-administered encounter medications on file as of 11/04/2018.   :   Review of Systems:  Out of a complete 14 point review of systems, all are reviewed and negative with the exception of these symptoms as listed below:  Observations/Objective: Her most recent vital signs on file for my review are from 08/26/2018: Blood pressure 100/65, pulse 74, weight 195.6 pounds for BMI of 33.57.   She has not recently weighed herself at home. She does report that her weight decreased by about 25 pounds since she started the Topamax. She has not had a chance to measure her neck circumference.she will call or email [place] back with it. On examination she is in no acute distress, pleasant, conversant. No problems with comprehension or  language. Speech is clear without dysarthria, hypophonia or voice tremor. Face is symmetric, facial animation normal. Extraocular movements are preserved. She wears corrective eyeglasses. Airway examination reveals a somewhat moderately crowded airway secondary to smaller airway opening and redundant soft palate. She has no significantly enlarged uvula, tonsils in place, not fully visualized for sizing. Tongue protrudes centrally and palate elevates symmetrically. Upper body movements are unremarkable, upper extremity coordination normal. She walks without difficulty.   Assessment and Plan: Ms. Lipps is a 43 year old right-handed woman with an underlying medical history of migraines, hypertension, depression, anxiety, arthritis, asthma and obesity, with whom I am conducting a virtual, video based new patient visit via  Webex in lieu of a face-to-face visit for evaluation of an underlying organic sleep disorder, in particular, concern for obstructive sleep apnea. The patient's medical history and physical exam (albeit limited with current video-based evaluation) are concerning for a diagnosis of obstructive sleep apnea. I discussed with the patient the diagnosis of OSA, its prognosis and treatment options. I explained in particular the risks and ramifications of untreated moderate to severe OSA, especially with respect to developing cardiovascular disease down the Road, including congestive heart failure, difficult to treat hypertension, cardiac arrhythmias, or stroke. Even type 2 diabetes has, in part, been linked to untreated OSA. Symptoms of untreated OSA may include daytime sleepiness, memory problems, mood irritability and mood disorder such as depression and anxiety, lack of energy, as well as recurrent headaches, especially morning headaches. We talked about smoking cessation and the importance of weight control. We talked about the importance of maintaining good sleep hygiene. I recommended the  following at this time: home sleep test.  I explained the sleep test procedure to the patient and also outlined possible treatment options of OSA, including the use of a custom-made dental device (which would require a referral to a specialist dentist), upper airway surgical options, (such as UPPP, which would involve a referral to an ENT). I also explained the CPAP vs. AutoPAP treatment option to the patient, who indicated that she would be willing to try CPAP if the need arises. I answered all her questions today and the patient was in agreement. I plan to see the patient back after the sleep study is completed and encouraged her to call with any interim questions, concerns, problems or updates.   [name], MD, PhD  Follow Up Instructions:  1. HST, sleep lab staff will reach out to patient to arrange for sending the unit to home address or "drive by pickup" and for tutorial, making sure patient is comfortable with the unit and usage.  2. Consider AutoPap therapy, if home sleep test positive for obstructive sleep apnea, patient agreeable. 3. We talked about aldternative treatment options and current limitations.  4. Follow-up after starting AutoPap therapy, follow-up to be scheduled according to set-up date. 5. Pursue healthy lifestyle, good sleep hygiene, healthy weight. 6. Call for any interim questions or concerns.   I discussed the assessment and treatment plan with the patient. The patient was provided an opportunity to ask questions and all were answered. The patient agreed with the plan and demonstrated an understanding of the instructions.   The patient was advised to call back or seek an in-person evaluation if the symptoms worsen or if the condition fails to improve as anticipated.  I provided 30 minutes of non-face-to-face time during this encounter.   [name], MD

## 2018-11-19 ENCOUNTER — Institutional Professional Consult (permissible substitution): Payer: BLUE CROSS/BLUE SHIELD | Admitting: Neurology

## 2018-11-23 DIAGNOSIS — G43009 Migraine without aura, not intractable, without status migrainosus: Secondary | ICD-10-CM | POA: Diagnosis not present

## 2018-11-23 DIAGNOSIS — E785 Hyperlipidemia, unspecified: Secondary | ICD-10-CM | POA: Diagnosis not present

## 2018-11-23 DIAGNOSIS — I700 Atherosclerosis of aorta: Secondary | ICD-10-CM | POA: Diagnosis not present

## 2018-11-23 DIAGNOSIS — I10 Essential (primary) hypertension: Secondary | ICD-10-CM | POA: Diagnosis not present

## 2018-11-30 ENCOUNTER — Ambulatory Visit (INDEPENDENT_AMBULATORY_CARE_PROVIDER_SITE_OTHER): Payer: BLUE CROSS/BLUE SHIELD | Admitting: Neurology

## 2018-11-30 ENCOUNTER — Other Ambulatory Visit: Payer: Self-pay

## 2018-11-30 DIAGNOSIS — R0683 Snoring: Secondary | ICD-10-CM

## 2018-11-30 DIAGNOSIS — E669 Obesity, unspecified: Secondary | ICD-10-CM

## 2018-11-30 DIAGNOSIS — G4733 Obstructive sleep apnea (adult) (pediatric): Secondary | ICD-10-CM | POA: Diagnosis not present

## 2018-11-30 DIAGNOSIS — G4730 Sleep apnea, unspecified: Secondary | ICD-10-CM

## 2018-11-30 DIAGNOSIS — G4719 Other hypersomnia: Secondary | ICD-10-CM

## 2018-12-03 ENCOUNTER — Telehealth: Payer: Self-pay

## 2018-12-03 NOTE — Telephone Encounter (Signed)
Patient returning your call.

## 2018-12-03 NOTE — Telephone Encounter (Signed)
I called pt to discuss her sleep study results. No answer, left a message asking her to call me back. 

## 2018-12-03 NOTE — Telephone Encounter (Signed)
-----   Message from [name], MD sent at 12/03/2018  8:11 AM EDT ----- Patient referred by Dr. Jannifer Franklin and CM, seen by me on 11/04/18 in VV, HST on 11/30/18.   Please call and notify the patient that the recent home sleep test showed borderline HST (5.1/h, O2 nadir of 91%). Treatment with positive airway pressure is not warranted, but can be considered with autoPAP, if desired by patient. Treatment options otherwise include weight loss and avoidance of the supine sleep position or a dental device. Please let me know, if she wants to try autoPAP and we can see if ins will cover it by sending the order to a DME. Otherwise, weight loss and avoiding sleep on the back may suffice as treatment.  Thanks,  [name], MD, PhD [facility])

## 2018-12-03 NOTE — Telephone Encounter (Signed)
I called pt and discussed her sleep study results. Pt will pursue weight loss and avoid sleeping on her back. Pt verbalized understanding of results. Pt had no questions at this time but was encouraged to call back if questions arise.

## 2018-12-03 NOTE — Progress Notes (Signed)
Patient referred by Dr. Jannifer Franklin and CM, seen by me on 11/04/18 in VV, HST on 11/30/18.   Please call and notify the patient that the recent home sleep test showed borderline HST (5.1/h, O2 nadir of 91%). Treatment with positive airway pressure is not warranted, but can be considered with autoPAP, if desired by patient. Treatment options otherwise include weight loss and avoidance of the supine sleep position or a dental device. Please let me know, if she wants to try autoPAP and we can see if ins will cover it by sending the order to a DME. Otherwise, weight loss and avoiding sleep on the back may suffice as treatment.  Thanks,  [name], MD, PhD [facility])

## 2018-12-03 NOTE — Procedures (Signed)
Patient Information     First Name: Sonda Primes Last Name: Klugh ID: [id number]  Birth Date: [birth year] Age: 43 Gender: Female  Referring Provider: Donald Prose, MD BMI: 33.56(64"/195lbs)     Epworth: 9/24  Sleep Study Information    Study Date: Nov 30, 2018 S/H/A Version: [id number] / 4.1.1528 / 30   History: Nebergall is a 43 year old woman with an underlying medical history of migraines, hypertension, depression, anxiety, arthritis, asthma and obesity, who reports snoring and morning headaches, some witnessed apneas. Summary & Diagnosis:    Borderline OSA  Recommendations:     This HST shows borderline obstructive sleep apnea, with an AHI of 5.1/hour and O2 nadir of 91%. Treatment with positive airway pressure is not warranted, but can be considered with autoPAP, if desired by patient. Treatment options otherwise include weight loss and avoidance of the supine sleep position or a dental device. These different avenues will be discussed with the patient. The patient will be seen in follow up in sleep clinic, if necessary. Please note, that other causes of the patient's symptoms, including circadian rhythm disturbances, an underlying mood disorder, medication effect and/or an underlying medical problem cannot be ruled out based on this test. Clinical correlation is recommended. The patient should be cautioned not to drive, work at heights, or operate dangerous or heavy equipment when tired or sleepy. Review and reiteration of good sleep hygiene measures should be pursued with any patient. The referring provider will be notified of the test results.   I certify that I have reviewed the raw data recording prior to the issuance of this report in accordance with the standards of the American Academy of Sleep Medicine (AASM).  [name], MD, PhD  [facility]) [place], American Board of Psychiatry and Neurology Diplomat, [place] Board of Sleep Medicine              Sleep Summary  Oxygen Saturation Statistics   Start Study Time: End Study Time: Total Recording Time:  10:47:15 PM        8:00:41 AM   9 hrs, 13 min  Total Sleep Time % REM of Sleep Time:  7 hrs, 21 min 32.9    Mean: 94 Minimum: 91 Maximum: 100  Mean of Desaturations Nadirs (%):   94  Oxygen Desaturation %: 4-9 10-20 >20 Total  Events Number Total  9 100.0  0 0.0  0 0.0  9 100.0  Oxygen Saturation: <90 <=88 <85 <80 <70  Duration (minutes): Sleep % 0.0 0.0 0.0 0.0 0.0 0.0 0.0 0.0 0.0 0.0     Respiratory Indices      Total Events REM NREM All Night  pRDI:  63  pAHI:  37 ODI:  9  pAHIc:  1  % CSR: 0.0 10.1 6.3 1.7 0.0 7.9 4.5 1.0 0.2 8.6 5.1 1.2 0.2       Pulse Rate Statistics during Sleep (BPM)      Mean: 75 Minimum: 48 Maximum: 100    Indices are calculated using technically valid sleep time of  7 hrs, 17 min. Central-Indices are calculated using technically valid sleep time of  6  hrs, 52 min. pRDI/pAHI are calculated using oxi desaturations ? 3%  Body Position Statistics  Position Supine Prone Right Left Non-Supine  Sleep (min) 71.0 134.0 217.9 18.5 370.4  Sleep % 16.1 30.4 49.4 4.2 83.9  pRDI 5.9 5.5 10.5 20.1 9.2  pAHI 2.5 4.6 5.8 10.0 5.6  ODI 0.0 0.5 2.2 0.0 1.5  Snoring Statistics Snoring Level (dB) >40 >50 >60 >70 >80 >Threshold (45)  Sleep (min) 161.3 4.8 0.9 0.0 0.0 9.5  Sleep % 36.5 1.1 0.2 0.0 0.0 2.1    Mean: 41 dB Sleep Stages Chart

## 2019-03-12 DIAGNOSIS — L732 Hidradenitis suppurativa: Secondary | ICD-10-CM | POA: Diagnosis not present

## 2019-03-12 DIAGNOSIS — L700 Acne vulgaris: Secondary | ICD-10-CM | POA: Diagnosis not present

## 2019-03-12 DIAGNOSIS — L720 Epidermal cyst: Secondary | ICD-10-CM | POA: Diagnosis not present

## 2019-04-20 DIAGNOSIS — M533 Sacrococcygeal disorders, not elsewhere classified: Secondary | ICD-10-CM | POA: Diagnosis not present

## 2019-04-20 DIAGNOSIS — M5417 Radiculopathy, lumbosacral region: Secondary | ICD-10-CM | POA: Diagnosis not present

## 2019-04-23 DIAGNOSIS — M79671 Pain in right foot: Secondary | ICD-10-CM | POA: Diagnosis not present

## 2019-05-04 ENCOUNTER — Telehealth: Payer: Self-pay

## 2019-05-04 MED ORDER — TOPIRAMATE 25 MG PO TABS: ORAL_TABLET | ORAL | 1 refills | Status: AC

## 2019-05-04 NOTE — Addendum Note (Signed)
Addended by: Kathrynn Ducking on: 05/04/2019 07:22 AM   Modules accepted: Orders

## 2019-05-04 NOTE — Telephone Encounter (Signed)
The Topamax will be refilled.

## 2019-05-04 NOTE — Telephone Encounter (Signed)
Patient is requesting a refill on her topiramate (TOPAMAX) 25 MG tablet. Pharmacy has been confirmed on file.

## 2019-05-18 DIAGNOSIS — Z6829 Body mass index (BMI) 29.0-29.9, adult: Secondary | ICD-10-CM | POA: Diagnosis not present

## 2019-05-18 DIAGNOSIS — M461 Sacroiliitis, not elsewhere classified: Secondary | ICD-10-CM | POA: Diagnosis not present

## 2019-05-18 DIAGNOSIS — M961 Postlaminectomy syndrome, not elsewhere classified: Secondary | ICD-10-CM | POA: Diagnosis not present

## 2019-05-26 DIAGNOSIS — M461 Sacroiliitis, not elsewhere classified: Secondary | ICD-10-CM | POA: Diagnosis not present

## 2019-05-26 DIAGNOSIS — Z6825 Body mass index (BMI) 25.0-25.9, adult: Secondary | ICD-10-CM | POA: Diagnosis not present

## 2019-06-22 DIAGNOSIS — M5417 Radiculopathy, lumbosacral region: Secondary | ICD-10-CM | POA: Diagnosis not present

## 2019-06-30 DIAGNOSIS — I10 Essential (primary) hypertension: Secondary | ICD-10-CM | POA: Diagnosis not present

## 2019-06-30 DIAGNOSIS — N644 Mastodynia: Secondary | ICD-10-CM | POA: Diagnosis not present

## 2019-06-30 DIAGNOSIS — N6320 Unspecified lump in the left breast, unspecified quadrant: Secondary | ICD-10-CM | POA: Diagnosis not present

## 2019-06-30 DIAGNOSIS — M5417 Radiculopathy, lumbosacral region: Secondary | ICD-10-CM | POA: Diagnosis not present

## 2019-06-30 DIAGNOSIS — R634 Abnormal weight loss: Secondary | ICD-10-CM | POA: Diagnosis not present

## 2019-07-01 ENCOUNTER — Other Ambulatory Visit: Payer: Self-pay | Admitting: Family Medicine

## 2019-07-01 DIAGNOSIS — N63 Unspecified lump in unspecified breast: Secondary | ICD-10-CM

## 2019-07-06 ENCOUNTER — Ambulatory Visit
Admission: RE | Admit: 2019-07-06 | Discharge: 2019-07-06 | Disposition: A | Discharge status: Home / Self Care | Payer: BLUE CROSS/BLUE SHIELD | Source: Ambulatory Visit | Attending: Family Medicine | Admitting: Family Medicine

## 2019-07-06 ENCOUNTER — Other Ambulatory Visit: Payer: Self-pay

## 2019-07-06 ENCOUNTER — Ambulatory Visit
Admission: RE | Admit: 2019-07-06 | Discharge: 2019-07-06 | Disposition: A | Discharge status: Home / Self Care | Payer: BC Managed Care – PPO | Source: Ambulatory Visit | Attending: Family Medicine | Admitting: Family Medicine

## 2019-07-06 DIAGNOSIS — R928 Other abnormal and inconclusive findings on diagnostic imaging of breast: Secondary | ICD-10-CM | POA: Diagnosis not present

## 2019-07-06 DIAGNOSIS — N6321 Unspecified lump in the left breast, upper outer quadrant: Secondary | ICD-10-CM | POA: Diagnosis not present

## 2019-07-06 DIAGNOSIS — N63 Unspecified lump in unspecified breast: Secondary | ICD-10-CM

## 2019-07-06 DIAGNOSIS — N6489 Other specified disorders of breast: Secondary | ICD-10-CM | POA: Diagnosis not present

## 2020-02-22 ENCOUNTER — Other Ambulatory Visit: Payer: Self-pay | Admitting: Neurology

## 2020-09-02 ENCOUNTER — Emergency Department (HOSPITAL_COMMUNITY): Payer: Self-pay

## 2020-09-02 ENCOUNTER — Other Ambulatory Visit: Payer: Self-pay

## 2020-09-02 ENCOUNTER — Encounter (HOSPITAL_COMMUNITY): Payer: Self-pay | Admitting: Emergency Medicine

## 2020-09-02 ENCOUNTER — Emergency Department (HOSPITAL_COMMUNITY)
Admission: EM | Admit: 2020-09-02 | Discharge: 2020-09-02 | Disposition: A | Discharge status: Home / Self Care | Payer: Self-pay | Attending: Emergency Medicine | Admitting: Emergency Medicine

## 2020-09-02 DIAGNOSIS — R110 Nausea: Secondary | ICD-10-CM | POA: Insufficient documentation

## 2020-09-02 DIAGNOSIS — R42 Dizziness and giddiness: Secondary | ICD-10-CM

## 2020-09-02 DIAGNOSIS — F17200 Nicotine dependence, unspecified, uncomplicated: Secondary | ICD-10-CM | POA: Insufficient documentation

## 2020-09-02 DIAGNOSIS — R079 Chest pain, unspecified: Secondary | ICD-10-CM

## 2020-09-02 DIAGNOSIS — J45909 Unspecified asthma, uncomplicated: Secondary | ICD-10-CM | POA: Insufficient documentation

## 2020-09-02 DIAGNOSIS — Z79899 Other long term (current) drug therapy: Secondary | ICD-10-CM | POA: Insufficient documentation

## 2020-09-02 DIAGNOSIS — I10 Essential (primary) hypertension: Secondary | ICD-10-CM | POA: Insufficient documentation

## 2020-09-02 LAB — CBC
HCT: 43.3 % (ref 36.0–46.0)
Hemoglobin: 14.7 g/dL (ref 12.0–15.0)
MCH: 33 pg (ref 26.0–34.0)
MCHC: 33.9 g/dL (ref 30.0–36.0)
MCV: 97.1 fL (ref 80.0–100.0)
Platelets: 235 10*3/uL (ref 150–400)
RBC: 4.46 MIL/uL (ref 3.87–5.11)
RDW: 13 % (ref 11.5–15.5)
WBC: 10.3 10*3/uL (ref 4.0–10.5)
nRBC: 0 % (ref 0.0–0.2)

## 2020-09-02 LAB — BASIC METABOLIC PANEL
Anion gap: 8 (ref 5–15)
BUN: 6 mg/dL (ref 6–20)
CO2: 24 mmol/L (ref 22–32)
Calcium: 9.2 mg/dL (ref 8.9–10.3)
Chloride: 104 mmol/L (ref 98–111)
Creatinine, Ser: 0.63 mg/dL (ref 0.44–1.00)
GFR, Estimated: >60 mL/min (ref >60)
Glucose, Bld: 94 mg/dL (ref 70–99)
Potassium: 3.8 mmol/L (ref 3.5–5.1)
Sodium: 136 mmol/L (ref 135–145)

## 2020-09-02 LAB — TROPONIN I (HIGH SENSITIVITY)
Troponin I (High Sensitivity): <2 ng/L (ref <18)
Troponin I (High Sensitivity): <2 ng/L (ref <18)

## 2020-09-02 MED ORDER — MECLIZINE HCL 12.5 MG PO TABS
25.0000 mg | ORAL_TABLET | Freq: Once | ORAL | Status: AC
  Administered 2020-09-02: 25 mg via ORAL
  Filled 2020-09-02: qty 2

## 2020-09-02 MED ORDER — IOHEXOL 350 MG/ML SOLN
125.0000 mL | Freq: Once | INTRAVENOUS | Status: AC | PRN
  Administered 2020-09-02: 125 mL via INTRAVENOUS

## 2020-09-02 MED ORDER — MECLIZINE HCL 25 MG PO TABS: 25.0000 mg | ORAL_TABLET | Freq: Three times a day (TID) | ORAL | 0 refills | Status: AC | PRN

## 2020-09-02 NOTE — ED Triage Notes (Signed)
Pt c/o dizziness, chest pain that radiates to back inbetween her shoulder blades that started yesterday morning.

## 2020-09-02 NOTE — ED Provider Notes (Signed)
[facility] Provider Note   CSN: [id number] Arrival date & time: 09/02/20  1600     History Chief Complaint  Patient presents with  . Chest Pain    Kristin Perry is a 45 y.o. female.  HPI Patient presents with 2 days of chest pain and dizziness.  States that she has pain in her mid chest upper back.  States is been pretty constant since yesterday morning.  Not really associated with eating.  Has had dizziness.  States she is also has vertigo.  States she feels as if the room was spinning around.  States she has not slept well because of the nausea.  States she has been having some chest pain like this however over the last bit of time.  Not exertional.  Comes and goes and wants.  No known cardiac history but states there is a family history.  No fevers or chills.  No cough.  History of hypertension.  States she was previously on medications but then lost a bunch of weight and the medications were stopped.  States the dizziness is worse when she turns her head.  No ringing in her ears.    Past Medical History:  Diagnosis Date  . Anxiety 03/15/2012  . Arthritis   . Asthma   . BOILS, RECURRENT 12/30/2006   Qualifier: Diagnosis of  By: Mellody Drown MD, [facility]    . DEPRESSION, MAJOR, RECURRENT 09/18/2006   Qualifier: Diagnosis of  By: Herma Ard    . Hypertension   . INCONTINENCE, STRESS, FEMALE 09/18/2006   Qualifier: Diagnosis of  By: Herma Ard    . Mass on back   . MENORRHAGIA 06/27/2009   Qualifier: Diagnosis of  By: Netty Starring  MD, Lucianne Muss    . Migraines   . VENEREAL WART 06/05/2009   Qualifier: Diagnosis of  By: [place], Bonnie      Patient Active Problem List   Diagnosis Date Noted  . Somnolence, daytime 08/26/2018  . Migraine headache 05/15/2018  . Intractable nausea and vomiting 05/15/2018  . Leukocytosis 05/15/2018  . Hypokalemia 05/15/2018  . Elevated hemoglobin ([place]) 05/15/2018  . HTN (hypertension) 05/15/2018  . Spondylolisthesis  of lumbar region 04/03/2016  . Vaginal discharge 11/29/2013  . Low back pain 03/26/2013  . HYPERTENSION, BENIGN ESSENTIAL 06/05/2009  . Hyperlipidemia 11/07/2006  . OBESITY, NOS 09/18/2006  . TOBACCO DEPENDENCE 09/18/2006  . Migraine 09/18/2006    Past Surgical History:  Procedure Laterality Date  . BACK SURGERY  2017   L5-S1 spinal fusion  . LIPOMA EXCISION N/A 11/26/2017   Procedure: EXCISION OF BACK LIPOMA;  Surgeon: Ralene Ok, MD;  Location: [facility];  Service: General;  Laterality: N/A;  . TUBAL LIGATION       OB History   No obstetric history on file.     Family History  Problem Relation Age of Onset  . Healthy Mother   . Liver cancer Father   . Healthy Brother   . Healthy Daughter   . Healthy Daughter     Social History   Tobacco Use  . Smoking status: Current Every Day Smoker    Packs/day: 1.00  . Smokeless tobacco: Never Used  Vaping Use  . Vaping Use: Never used  Substance Use Topics  . Alcohol use: No  . Drug use: No    Home Medications Prior to Admission medications   Medication Sig Start Date End Date Taking? Authorizing Provider  meclizine (ANTIVERT) 25 MG tablet Take 1 tablet (25 mg total)  by mouth 3 (three) times daily as needed for dizziness. 09/02/20  Yes Davonna Belling, MD  lisinopril-hydrochlorothiazide (PRINZIDE,ZESTORETIC) 10-12.5 MG tablet Take 1 tablet by mouth daily. 06/20/15   Patrecia Pour, MD  rosuvastatin (CRESTOR) 20 MG tablet Take 20 mg by mouth daily.    [provider]  topiramate (TOPAMAX) 25 MG tablet 3 tablets at night. 05/04/19   Kathrynn Ducking, MD    Allergies    Amoxicillin-pot clavulanate  Review of Systems   Review of Systems  Constitutional: Positive for fatigue. Negative for appetite change and fever.  HENT: Negative for congestion.   Respiratory: Negative for shortness of breath.   Cardiovascular: Positive for chest pain.  Gastrointestinal: Positive for nausea. Negative for vomiting.   Genitourinary: Negative for enuresis.  Musculoskeletal: Positive for back pain. Negative for myalgias.  Skin: Negative for rash.  Neurological: Positive for dizziness.  Psychiatric/Behavioral: Negative for confusion.    Physical Exam Updated Vital Signs BP (!) 130/97   Pulse 68   Temp 98.7 F (37.1 C)   Resp 18   Ht 5\' 4"  (1.626 m)   Wt 83.9 kg   LMP 08/22/2020   SpO2 99%   BMI 31.76 kg/m   Physical Exam Vitals and nursing note reviewed.  Constitutional:      Appearance: She is well-developed.  HENT:     Head: Normocephalic and atraumatic.  Eyes:     Extraocular Movements: Extraocular movements intact.     Pupils: Pupils are equal, round, and reactive to light.     Comments: Nystagmus with gaze to right.  Cardiovascular:     Rate and Rhythm: Normal rate and regular rhythm.  Pulmonary:     Breath sounds: No rhonchi.  Chest:     Chest wall: No tenderness.  Musculoskeletal:     Cervical back: Neck supple.     Right lower leg: No edema.  Skin:    General: Skin is warm.     Capillary Refill: Capillary refill takes less than 2 seconds.  Neurological:     Mental Status: She is alert and oriented to person, place, and time.     Comments: Finger-nose intact bilaterally.  Awake and appropriate.  Eye movements intact does have some nystagmus with gaze to right.     ED Results / Procedures / Treatments   Labs (all labs ordered are listed, but only abnormal results are displayed) Labs Reviewed  BASIC METABOLIC PANEL  CBC  TROPONIN I (HIGH SENSITIVITY)  TROPONIN I (HIGH SENSITIVITY)    EKG EKG Interpretation  Date/Time:  Saturday September 02 2020 16:35:39 EST Ventricular Rate:  80 PR Interval:  158 QRS Duration: 106 QT Interval:  388 QTC Calculation: 447 R Axis:   60 Text Interpretation: Normal sinus rhythm Incomplete right bundle branch block Borderline ECG Confirmed by Davonna Belling [phone]) on 09/02/2020 5:18:45 PM   Radiology CT Angio Head W or Wo  Contrast  Result Date: 09/02/2020 CLINICAL DATA:  Vertigo, central. Additional history provided: Dizziness, chest pain radiating to back in between shoulder blades which began this morning, hypertension. EXAM: CT ANGIOGRAPHY HEAD AND NECK TECHNIQUE: Multidetector CT imaging of the head and neck was performed using the standard protocol during bolus administration of intravenous contrast. Multiplanar CT image reconstructions and MIPs were obtained to evaluate the vascular anatomy. Carotid stenosis measurements (when applicable) are obtained utilizing NASCET criteria, using the distal internal carotid diameter as the denominator. CONTRAST:  159mL OMNIPAQUE IOHEXOL 350 MG/ML SOLN COMPARISON:  Noncontrast head CT  May 16, 2018. FINDINGS: CT HEAD FINDINGS Brain: Cerebral volume is normal. There is no acute intracranial hemorrhage. No demarcated cortical infarct. No extra-axial fluid collection. No evidence of intracranial mass. No midline shift. Partially empty sella turcica. Vascular: No hyperdense vessel. Skull: Normal. Negative for fracture or focal lesion. Sinuses: Small volume secretions within the right frontal sinus. Mild partial opacification of bilateral ethmoid air cells. Orbits: No mass or acute finding. Review of the MIP images confirms the above findings CTA NECK FINDINGS Aortic arch: Common origin of the innominate and left common carotid arteries. The visualized aortic arch is otherwise unremarkable. No hemodynamically significant innominate or proximal subclavian artery stenosis. Right carotid system: CCA and ICA patent within the neck without stenosis. No significant atherosclerotic disease. Left carotid system: CCA and ICA patent within the neck without stenosis. Minimal soft and calcified plaque within the proximal ICA. Vertebral arteries: Codominant and patent within the neck without stenosis. Skeleton: No acute bony abnormality or aggressive osseous lesion. Other neck: No neck mass or cervical  lymphadenopathy. Thyroid unremarkable. Upper chest: Reported separately. No consolidation within the imaged lung apices. Review of the MIP images confirms the above findings CTA HEAD FINDINGS Anterior circulation: Suboptimal evaluation of the intracranial arterial circulation due to significant venous contamination. The intracranial internal carotid arteries are patent. The M1 middle cerebral arteries are patent. No M2 proximal branch occlusion or high-grade proximal stenosis is identified. The anterior cerebral arteries are patent. No intracranial aneurysm is identified. Posterior circulation: The intracranial vertebral arteries are patent. The basilar artery is patent. The posterior cerebral arteries are patent. Posterior communicating arteries are hypoplastic or absent bilaterally. Venous sinuses: Within the limitations of contrast timing, no convincing thrombus. Prominent arachnoid granulations within the transverse sinuses bilaterally. Anatomic variants: As described Review of the MIP images confirms the above findings IMPRESSION: CT head: 1. No evidence of acute intracranial abnormality. 2. Partially empty sella turcica. This finding is very commonly incidental, but can be associated with idiopathic intracranial hypertension. 3. Right frontal and bilateral ethmoid sinusitis. CTA neck: The common carotid, internal carotid and vertebral arteries are patent within the neck without hemodynamically significant stenosis. Minimal atherosclerotic plaque within the proximal left ICA. CTA head: 1. Suboptimal evaluation of the intracranial arterial circulation due to significant venous contamination. 2. No evidence of intracranial large vessel occlusion or proximal high-grade arterial stenosis. Electronically Signed   By: Kellie Simmering DO   On: 09/02/2020 18:52   DG Chest 2 View  Result Date: 09/02/2020 CLINICAL DATA:  Dizziness and chest pain. EXAM: CHEST - 2 VIEW COMPARISON:  09/23/2012 FINDINGS: The cardiac  silhouette, mediastinal and hilar contours normal. The lungs are clear of an acute process. No pleural effusions or pulmonary nodules. The bony thorax is intact. IMPRESSION: No acute cardiopulmonary findings. Electronically Signed   By: Marijo Sanes M.D.   On: 09/02/2020 16:58   CT Angio Neck W and/or Wo Contrast  Result Date: 09/02/2020 CLINICAL DATA:  Vertigo, central. Additional history provided: Dizziness, chest pain radiating to back in between shoulder blades which began this morning, hypertension. EXAM: CT ANGIOGRAPHY HEAD AND NECK TECHNIQUE: Multidetector CT imaging of the head and neck was performed using the standard protocol during bolus administration of intravenous contrast. Multiplanar CT image reconstructions and MIPs were obtained to evaluate the vascular anatomy. Carotid stenosis measurements (when applicable) are obtained utilizing NASCET criteria, using the distal internal carotid diameter as the denominator. CONTRAST:  132mL OMNIPAQUE IOHEXOL 350 MG/ML SOLN COMPARISON:  Noncontrast head CT May 16, 2018. FINDINGS: CT HEAD FINDINGS Brain: Cerebral volume is normal. There is no acute intracranial hemorrhage. No demarcated cortical infarct. No extra-axial fluid collection. No evidence of intracranial mass. No midline shift. Partially empty sella turcica. Vascular: No hyperdense vessel. Skull: Normal. Negative for fracture or focal lesion. Sinuses: Small volume secretions within the right frontal sinus. Mild partial opacification of bilateral ethmoid air cells. Orbits: No mass or acute finding. Review of the MIP images confirms the above findings CTA NECK FINDINGS Aortic arch: Common origin of the innominate and left common carotid arteries. The visualized aortic arch is otherwise unremarkable. No hemodynamically significant innominate or proximal subclavian artery stenosis. Right carotid system: CCA and ICA patent within the neck without stenosis. No significant atherosclerotic disease. Left  carotid system: CCA and ICA patent within the neck without stenosis. Minimal soft and calcified plaque within the proximal ICA. Vertebral arteries: Codominant and patent within the neck without stenosis. Skeleton: No acute bony abnormality or aggressive osseous lesion. Other neck: No neck mass or cervical lymphadenopathy. Thyroid unremarkable. Upper chest: Reported separately. No consolidation within the imaged lung apices. Review of the MIP images confirms the above findings CTA HEAD FINDINGS Anterior circulation: Suboptimal evaluation of the intracranial arterial circulation due to significant venous contamination. The intracranial internal carotid arteries are patent. The M1 middle cerebral arteries are patent. No M2 proximal branch occlusion or high-grade proximal stenosis is identified. The anterior cerebral arteries are patent. No intracranial aneurysm is identified. Posterior circulation: The intracranial vertebral arteries are patent. The basilar artery is patent. The posterior cerebral arteries are patent. Posterior communicating arteries are hypoplastic or absent bilaterally. Venous sinuses: Within the limitations of contrast timing, no convincing thrombus. Prominent arachnoid granulations within the transverse sinuses bilaterally. Anatomic variants: As described Review of the MIP images confirms the above findings IMPRESSION: CT head: 1. No evidence of acute intracranial abnormality. 2. Partially empty sella turcica. This finding is very commonly incidental, but can be associated with idiopathic intracranial hypertension. 3. Right frontal and bilateral ethmoid sinusitis. CTA neck: The common carotid, internal carotid and vertebral arteries are patent within the neck without hemodynamically significant stenosis. Minimal atherosclerotic plaque within the proximal left ICA. CTA head: 1. Suboptimal evaluation of the intracranial arterial circulation due to significant venous contamination. 2. No evidence of  intracranial large vessel occlusion or proximal high-grade arterial stenosis. Electronically Signed   By: Kellie Simmering DO   On: 09/02/2020 18:52   CT ANGIO CHEST AORTA W/CM & OR WO/CM  Result Date: 09/02/2020 CLINICAL DATA:  Chest pain radiating to the back EXAM: CT ANGIOGRAPHY CHEST WITH CONTRAST TECHNIQUE: Multidetector CT imaging of the chest was performed using the standard protocol during bolus administration of intravenous contrast. Multiplanar CT image reconstructions and MIPs were obtained to evaluate the vascular anatomy using dissection protocol optimized for systemic arterial opacification. CONTRAST:  121mL OMNIPAQUE IOHEXOL 350 MG/ML SOLN COMPARISON:  Chest radiograph 09/02/2020 FINDINGS: Cardiovascular: No aortic dissection or acute aortic findings. Branch vessels appear unremarkable. Patent proximal vertebral arteries. Today's exam was not optimized for pulmonary arterial opacification, but I not see any large central clot in the pulmonary arterial tree. The dilution of pulmonary arterial contrast makes assessment of the pulmonary arteries suboptimal. Mediastinum/Nodes: Suspected small type 1 hiatal hernia. Lungs/Pleura: Scattered bulla with a suggestion of mild emphysema. Airway thickening is present, suggesting bronchitis or reactive airways disease. 2 mm right upper lobe nodule on image 49 series 9 posteriorly. Minimal subpleural nodularity along the minor fissure compatible with small subpleural lymph  nodes. Upper Abdomen: Unremarkable Musculoskeletal: Minimal thoracic spondylosis. Review of the MIP images confirms the above findings. IMPRESSION: 1. No aortic dissection or acute aortic findings. 2. Airway thickening is present, suggesting bronchitis or reactive airways disease. 3. Scattered bulla with a suggestion of mild emphysema. 4. Suspected small type 1 hiatal hernia. Emphysema (ICD10-J43.9). Electronically Signed   By: Van Clines M.D.   On: 09/02/2020 18:48     Procedures Procedures   Medications Ordered in ED Medications  meclizine (ANTIVERT) tablet 25 mg (25 mg Oral Given 09/02/20 1734)  iohexol (OMNIPAQUE) 350 MG/ML injection 125 mL (125 mLs Intravenous Contrast Given 09/02/20 1820)    ED Course  I have reviewed the triage vital signs and the nursing notes.  Pertinent labs & imaging results that were available during my care of the patient were reviewed by me and considered in my medical decision making (see chart for details).    MDM Rules/Calculators/A&P                          Patient presents with chest pain.  Chest to back.  Comes and goes.  Not exertional.  May have some radiation to neck.  Also developed vertigo.  States the vertigo is the room spinning.  EKG reassuring.  CTA done of chest with the back pain and vertigo.  Did not show dissection or other clear cause of the pain.  CTA of head done but was not clear in delineating intracranial vasculature.  No other abnormality found on the head however.  Doubt this is a dissection causing the vertigo.  Embolic source of stroke felt less likely also.  Patient feeling better after Antivert.  Discussed with patient.  Doubt cardiac cause of the chest pain.  Patient has vertigo I think more likely peripheral.  Has outpatient follow-up with ENT.  Discussed possibility of central cause but do not feel needs MRI at this time. Final Clinical Impression(s) / ED Diagnoses Final diagnoses:  Nonspecific chest pain  Vertigo    Rx / DC Orders ED Discharge Orders         Ordered    meclizine (ANTIVERT) 25 MG tablet  3 times daily PRN        09/02/20 2029           Davonna Belling, MD 09/03/20 0007
